# Patient Record
Sex: Male | Born: 1970 | Race: White | Hispanic: No | Marital: Married | Smoking: Former smoker
Health system: Southern US, Community
[De-identification: ages and names within clinical notes are randomized; demographics above are authoritative.]

## PROBLEM LIST (undated history)

## (undated) HISTORY — PX: ROTATOR CUFF REPAIR: SHX139

---

## 2009-06-15 HISTORY — PX: BUNIONECTOMY: SHX129

## 2015-09-24 MED FILL — GABAPENTIN 300 MG CAPSULE: 300 | 30 days supply | Qty: 30 | Fill #0

## 2015-10-02 ENCOUNTER — Other Ambulatory Visit: Payer: Self-pay | Admitting: Orthopedic Surgery

## 2015-10-02 DIAGNOSIS — M25511 Pain in right shoulder: Secondary | ICD-10-CM

## 2015-10-03 MED FILL — traMADol HCL 50 MG TABS: 50 | 15 days supply | Qty: 60 | Fill #0

## 2015-10-11 ENCOUNTER — Ambulatory Visit
Admission: RE | Admit: 2015-10-11 | Discharge: 2015-10-11 | Disposition: A | Discharge status: Home / Self Care | Payer: Managed Care, Other (non HMO) | Source: Ambulatory Visit | Attending: Orthopedic Surgery | Admitting: Orthopedic Surgery

## 2015-10-11 ENCOUNTER — Other Ambulatory Visit: Payer: Self-pay

## 2015-10-11 DIAGNOSIS — M25511 Pain in right shoulder: Secondary | ICD-10-CM

## 2015-10-21 MED FILL — traMADol HCL 50 MG TABS: 50 | 15 days supply | Qty: 60 | Fill #0

## 2015-10-23 MED FILL — IBUPROFEN 800 MG TABLET: 800 | 20 days supply | Qty: 60 | Fill #0

## 2015-10-23 MED FILL — oxyCODONE HCL 5 MG TABS: 5 | 8 days supply | Qty: 60 | Fill #0

## 2015-10-29 MED FILL — CYCLOBENZAPRINE 10 MG TAB: 10 | 10 days supply | Qty: 30 | Fill #0

## 2015-10-29 MED FILL — oxyCODONE HCL 5 MG TABS: 5 | 4 days supply | Qty: 30 | Fill #0

## 2015-10-30 ENCOUNTER — Ambulatory Visit: Payer: Managed Care, Other (non HMO) | Attending: Orthopedic Surgery | Admitting: Physical Therapy

## 2015-10-30 ENCOUNTER — Encounter: Payer: Self-pay | Admitting: Physical Therapy

## 2015-10-30 DIAGNOSIS — M25511 Pain in right shoulder: Secondary | ICD-10-CM

## 2015-10-30 DIAGNOSIS — M25611 Stiffness of right shoulder, not elsewhere classified: Secondary | ICD-10-CM | POA: Diagnosis present

## 2015-10-30 NOTE — Therapy (Addendum)
[facility] [address]  [address] [place], [place], [address] Phone: [phone]   Fax:  [phone]  Physical Therapy Evaluation  Patient Details  Name: William [name] MRN: [id number] Date of Birth: 1970/11/24 Referring Provider: Georgena Spurling  Encounter Date: 10/30/2015      PT End of Session - 10/30/15 1026    Visit Number 1   Number of Visits 20   Date for PT Re-Evaluation 01/08/16   PT Start Time 1025      History reviewed. No pertinent past medical history.  Past Surgical History  Procedure Laterality Date  . Bunionectomy Right 2011    There were no vitals filed for this visit.       Subjective Assessment - 10/30/15 1027    Subjective Pt underwent R Shoulder mini open RCR on 10/23/15 in addition to SAD and DCR.  Since surgery he has been wearing sling all waking hours but is able to sleep with arm propped on pillows in recliner at night. Is taking pain medications and applies ice to shoulder regularly.  States has been performing pendulums has HEP.   Currently in Pain? Yes   Pain Score --  5-8/10 with movement assessment today; 1-2/10 at rest   Pain Location Shoulder   Pain Orientation Right   Pain Onset 1 to 4 weeks ago   Pain Frequency Intermittent   Aggravating Factors  movement   Pain Relieving Factors rest, ice, medication            [facility] PT Assessment - 10/30/15 0001    Assessment   Medical Diagnosis s/p R RCR   Referring Provider Georgena Spurling   Onset Date/Surgical Date 10/23/15   Hand Dominance Right   Next MD Visit 12/03/15   Balance Screen   Has the patient fallen in the past 6 months No   Has the patient had a decrease in activity level because of a fear of falling?  No   Is the patient reluctant to leave their home because of a fear of falling?  No   Prior Function   Vocation Full time employment   Health visitor - heavy labor   Leisure hunting and fishing   ROM /  Strength   AROM / PROM / Strength PROM   PROM   PROM Assessment Site Shoulder   Right/Left Shoulder Right   Right Shoulder Flexion 65 Degrees  limited by pain   Right Shoulder ABduction 70 Degrees  limited by protocol, no pain   Right Shoulder Internal Rotation --  to abdomen at 0-30 ABD   Right Shoulder External Rotation --  8dg short of neutral in 0-30 ABD         TODAY'S TREATMENT Manual - grade 1 AP to R GH along with PROM per protocol limits or to pt tolerance.  Vasopneumatic compression - low pressure, R Shoulder, 40dg, 15'                  PT Short Term Goals - 10/30/15 1202    PT SHORT TERM GOAL #1   Title R Shoulder Flexion PROM to 140 by 11/20/15   Status New   PT SHORT TERM GOAL #2   Title R Shoulder ER PROM to 45 at 60-80 ABD by 11/27/15   Status New   PT SHORT TERM GOAL #3   Title pt able to sleep in bed and without limitation by shoulder pain by 12/04/15   Status  New           PT Long Term Goals - 10/30/15 1206    PT LONG TERM GOAL #1   Title R Shoulder AROM at least: Flexion 155, ER 80 in 80-90 ABD, Scaption 160, and IR reach to L5 by 01/08/16   Status New   PT LONG TERM GOAL #2   Title R Shoulder MMT 4/5 or better all planes by 01/08/16   Status New   PT LONG TERM GOAL #3   Title pt able to return to work at full duty or mildly modified duty (may have to limit overhead activities longer) by 01/08/16     Status New   PT LONG TERM GOAL #4   Title Pt independent with HEP for continued progression as necessary by 01/08/16   Status New               Plan - 10/30/15 1213    Clinical Impression Statement William [name] underwent R mini open RCR on 10/23/15 due to increasing pain and decreasing function of R shoulder.  He states prior to surgery he could not raise his hand overhead.  He is employed full time in [employer] and this requires great deal of physical labor and is therefore out of work right now.  He states he is currently sleeping  pretty well in a recliner. He states this allows him to remove his sling and prop his arm up on pillows. He has been performing pendulums with R UE since surgery as advised by MD.  Today's PROM assessment finds good ABD to 70 with no pain (limited by protocol) but Flexion limited to 65 due to pain and ER quite restricted.  He is unable to achieve neutral ER anywhere between 0-30 ABD and is in fact 8 degrees away from neutral.  He is quite guarded right now so I am hopeful this will improve quickly as he is able to relax more. Overall, William [name] should progress well.  He is very motivated and is good physical shape otherwise.  We will progress per RC protocol as tolerated.    Rehab Potential Good   PT Frequency 2x / week   PT Duration Other (comment)  10 wks   PT Treatment/Interventions Manual techniques;Therapeutic exercise;Vasopneumatic Device;Taping;Therapeutic activities;Electrical Stimulation;Cryotherapy;Patient/family education   PT Next Visit Plan R Shoulder PROM, ensure wrist/elbow AROM HEP is being performed; manual and modalities for pain and decreased guarding   Consulted and Agree with Plan of Care Patient      Patient will benefit from skilled therapeutic intervention in order to improve the following deficits and impairments:  Pain, Decreased strength, Decreased mobility, Decreased range of motion, Impaired UE functional use  Visit Diagnosis: Pain in right shoulder  Stiffness of right shoulder, not elsewhere classified     Problem List There are no active problems to display for this patient.   [facility],Inessa Wardrop PT, OCS 10/30/2015, 12:35 PM  [facility] [address]  [address] [place], [place], [address] Phone: [phone]   Fax:  [phone]  Name: [name] MRN: [id number] Date of Birth: 12/05/1970

## 2015-11-04 ENCOUNTER — Ambulatory Visit: Payer: Managed Care, Other (non HMO) | Admitting: Physical Therapy

## 2015-11-04 DIAGNOSIS — M25511 Pain in right shoulder: Secondary | ICD-10-CM

## 2015-11-04 DIAGNOSIS — M25611 Stiffness of right shoulder, not elsewhere classified: Secondary | ICD-10-CM

## 2015-11-04 NOTE — Therapy (Signed)
[facility] [address]  [address] [place], [place], [address] Phone: [phone]   Fax:  [phone]  Physical Therapy Treatment  Patient Details  Name: William [name] MRN: [id number] Date of Birth: 1971-05-14 Referring Provider: Georgena Spurling  Encounter Date: 11/04/2015      PT End of Session - 11/04/15 0716    Visit Number 2   Number of Visits 20   Date for PT Re-Evaluation 01/08/16   PT Start Time 0714   PT Stop Time 0755   PT Time Calculation (min) 41 min      No past medical history on file.  Past Surgical History  Procedure Laterality Date  . Bunionectomy Right 2011    There were no vitals filed for this visit.      Subjective Assessment - 11/04/15 0715    Subjective States shoulder is feeling better and pain no greater than 5/10 lately.  States has cut back on pain medication due to decreasing pain.  Performs pendulums 20+ x/day.   Currently in Pain? Yes   Pain Score 5    Pain Location Shoulder   Pain Orientation Right         TODAY'S TREATMENT Manual - R GH Grade 1 AP and caudal glides with PROM into ABD to 70, Flexion to tolerance (90 today), IR to abdomen at 0-30 ABD, and ER to 10-20 degrees in 0-30 ABD; grade 2 AP and caudal glides R ACJ.  Vasopneumatic compression - R Shoulder, Low Pressure, 38dg, 15'             PT Short Term Goals - 11/04/15 0749    PT SHORT TERM GOAL #1   Title R Shoulder Flexion PROM to 140 by 11/20/15   Status On-going   PT SHORT TERM GOAL #2   Title R Shoulder ER PROM to 45 at 60-80 ABD by 11/27/15   Status On-going   PT SHORT TERM GOAL #3   Title pt able to sleep in bed and without limitation by shoulder pain by 12/04/15   Status On-going           PT Long Term Goals - 11/04/15 0750    PT LONG TERM GOAL #1   Title R Shoulder AROM at least: Flexion 155, ER 80 in 80-90 ABD, Scaption 160, and IR reach to L5 by 01/08/16   Status On-going   PT LONG TERM  GOAL #2   Title R Shoulder MMT 4/5 or better all planes by 01/08/16   Status On-going   PT LONG TERM GOAL #3   Title pt able to return to work at full duty or mildly modified duty (may have to limit overhead activities longer) by 01/08/16     Status On-going   PT LONG TERM GOAL #4   Title Pt independent with HEP for continued progression as necessary by 01/08/16   Status On-going               Plan - 11/04/15 0746    Clinical Impression Statement Pain improving quite well already with pt stating pain hasn't been greater than 5/10 past several days.  States often feels nearly pain-free.  Is performing pendulums as well as elbow/wrist AROM throughout the day and ices shoulder 2-3x/day lately.  PROM today much improved and ER and ABD only limited by protocol at this point, Flexion PROM has improved to 90 and limited by pain/guarding at that point.   PT Next Visit Plan  R Shoulder PROM per protocol; manual and modalities for pain and decreased guarding   Consulted and Agree with Plan of Care Patient      Patient will benefit from skilled therapeutic intervention in order to improve the following deficits and impairments:  Pain, Decreased strength, Decreased mobility, Decreased range of motion, Impaired UE functional use  Visit Diagnosis: Pain in right shoulder  Stiffness of right shoulder, not elsewhere classified     Problem List There are no active problems to display for this patient.   Cyla Haluska PT, OCS 11/04/2015, 7:59 AM  [facility] [address]  [address] [place], [place], [address] Phone: [phone]   Fax:  [phone]  Name: William [name] MRN: [id number] Date of Birth: 03/04/1971

## 2015-11-06 ENCOUNTER — Ambulatory Visit: Payer: Managed Care, Other (non HMO) | Admitting: Physical Therapy

## 2015-11-06 DIAGNOSIS — M25511 Pain in right shoulder: Secondary | ICD-10-CM | POA: Diagnosis not present

## 2015-11-06 DIAGNOSIS — M25611 Stiffness of right shoulder, not elsewhere classified: Secondary | ICD-10-CM

## 2015-11-06 NOTE — Therapy (Signed)
[facility] Out[facility] [address]  [address] [place], [place], [address] Phone: [phone]   Fax:  [phone]  Physical Therapy Treatment  Patient Details  Name: William [name] MRN: [id number] Date of Birth: 1971-04-21 Referring Provider: Georgena Spurling  Encounter Date: 11/06/2015      PT End of Session - 11/06/15 0807    Visit Number 3   Number of Visits 20   Date for PT Re-Evaluation 01/08/16   PT Start Time 0802   PT Stop Time 0850   PT Time Calculation (min) 48 min      No past medical history on file.  Past Surgical History  Procedure Laterality Date  . Bunionectomy Right 2011    There were no vitals filed for this visit.      Subjective Assessment - 11/06/15 0802    Subjective States shoulder is feeling better and better stating most discomfort is due to sling use.  States is still sleeping in recliner with arm propped on pillows and states this is going fairly well.   Currently in Pain? Yes   Pain Score --  states AVG pain is 2-3/10 and notes intermittent short duration pains up to 4/10 when "moves it wrong"   Pain Location Shoulder   Pain Orientation Right            [facility] PT Assessment - 11/06/15 0001    PROM   Right Shoulder Flexion 112 Degrees   Right Shoulder ABduction 70 Degrees   Right Shoulder External Rotation --  30 at 30 ABD, 22 at 10 ABD       TODAY'S TREATMENT Manual - R GH Grade 1 AP and caudal glides with PROM into ABD to 70, Flexion to tolerance (112 today), IR to abdomen at 0-30 ABD, and ER to 10-30 degrees in 0-30 ABD; grade 2 AP and caudal glides R ACJ. Left Side-Lying R scapular mobs grade 3.  TherEx - L Side-Lying R scapular rolls AROM 15x each Seated Scapular retraction isometric 10x5"  Vasopneumatic compression - R Shoulder, Low Pressure, 38dg, 15'              PT Short Term Goals - 11/04/15 0749    PT SHORT TERM GOAL #1   Title R Shoulder Flexion PROM to 140 by  11/20/15   Status On-going   PT SHORT TERM GOAL #2   Title R Shoulder ER PROM to 45 at 60-80 ABD by 11/27/15   Status On-going   PT SHORT TERM GOAL #3   Title pt able to sleep in bed and without limitation by shoulder pain by 12/04/15   Status On-going           PT Long Term Goals - 11/04/15 0750    PT LONG TERM GOAL #1   Title R Shoulder AROM at least: Flexion 155, ER 80 in 80-90 ABD, Scaption 160, and IR reach to L5 by 01/08/16   Status On-going   PT LONG TERM GOAL #2   Title R Shoulder MMT 4/5 or better all planes by 01/08/16   Status On-going   PT LONG TERM GOAL #3   Title pt able to return to work at full duty or mildly modified duty (may have to limit overhead activities longer) by 01/08/16     Status On-going   PT LONG TERM GOAL #4   Title Pt independent with HEP for continued progression as necessary by 01/08/16   Status On-going  Plan - 11/06/15 0837    Clinical Impression Statement Mr. William [name] is doing exceptionally well to date. He is 2 weeks out from R RCR today and he states he is often pain-free and pain hasn't been greater than 4/10 at worst lately.  PROM has progressed to protocol limits (70 ABD, 30 ER at 30 ABD, and IR to Abdomen at 30 ABD) other than Flexion (no protocol limits) which has progressed to 112 dgs (was 65 last week at initial eval).  His protocol increases to allow some AAROM next week and pt is looking forward to this.   PT Next Visit Plan Progress HEP with protocol; R Shoulder PROM per protocol (next week: ABD to 90, Flexion to tolerance, ER to 45 at 0-45 ABD); begin pulleys, prone scap retraction with shoulder extension, and isometric IR and Extension; manual and modalities for pain and decreased guarding   Consulted and Agree with Plan of Care Patient      Patient will benefit from skilled therapeutic intervention in order to improve the following deficits and impairments:  Pain, Decreased strength, Decreased mobility, Decreased range  of motion, Impaired UE functional use  Visit Diagnosis: Pain in right shoulder  Stiffness of right shoulder, not elsewhere classified     Problem List There are no active problems to display for this patient.   [facility],Bradee Common PT, OCS 11/06/2015, 8:55 AM  [facility] [address]  [address] [place], [place], [address] Phone: [phone]   Fax:  [phone]  Name: William [name] MRN: [id number] Date of Birth: 06/08/1971

## 2015-11-08 MED FILL — oxyCODONE HCL 5 MG TABS: 5 | 4 days supply | Qty: 30 | Fill #0

## 2015-11-12 ENCOUNTER — Ambulatory Visit: Payer: Managed Care, Other (non HMO) | Admitting: Physical Therapy

## 2015-11-12 DIAGNOSIS — M25511 Pain in right shoulder: Secondary | ICD-10-CM

## 2015-11-12 DIAGNOSIS — M25611 Stiffness of right shoulder, not elsewhere classified: Secondary | ICD-10-CM

## 2015-11-12 NOTE — Therapy (Signed)
[name] [address]  [address] [place], [place], [address] Phone: [phone]   Fax:  [phone]  Physical Therapy Treatment  Patient Details  Name: William BarrDonald W [name] MRN: [id number] Date of Birth: 03/07/1971 Referring Provider: Georgena SpurlingStephen Lucey  Encounter Date: 11/12/2015      William [name] End of Session - 11/12/15 1153    Visit Number 4   Number of Visits 20   Date for William [name] Re-Evaluation 01/08/16   William [name] Start Time 1116  vaso unavailable and William [name] declined ice   William [name] Stop Time 1148   William [name] Time Calculation (min) 32 min   Activity Tolerance Patient tolerated treatment well   Behavior During Therapy [facility] for tasks assessed/performed      No past medical history on file.  Past Surgical History  Procedure Laterality Date  . Bunionectomy Right 2011    There were no vitals filed for this visit.      Subjective Assessment - 11/12/15 1116    Subjective hasn't taken pain medication in over a week but overall feels good.  has some tenderness   Currently in Pain? No/denies            [facility] William [name] Assessment - 11/12/15 1133    PROM   Right Shoulder Flexion 133 Degrees   Right Shoulder External Rotation --  25 deg in 10 deg abdct                     [facility] Adult William [name] Treatment/Exercise - 11/12/15 1140    Exercises   Exercises Shoulder   Shoulder Exercises: Seated   Retraction Both;15 reps   Other Seated Exercises post shoulder rolls 2x15   Manual Therapy   Manual Therapy Joint mobilization;Passive ROM   Joint Mobilization A/P and inf R shoulder grade 2 mobilizations   Passive ROM R shoulder flexion; abdct to 70; er 25-30 deg up to 30 deg of abdct                  William [name] Short Term Goals - 11/04/15 0749    William [name] SHORT TERM GOAL #1   Title R Shoulder Flexion PROM to 140 by 11/20/15   Status On-going   William [name] SHORT TERM GOAL #2   Title R Shoulder ER PROM to 45 at 60-80 ABD by 11/27/15   Status On-going   William [name] SHORT TERM GOAL #3    Title William [name] able to sleep in bed and without limitation by shoulder pain by 12/04/15   Status On-going           William [name] Long Term Goals - 11/04/15 0750    William [name] LONG TERM GOAL #1   Title R Shoulder AROM at least: Flexion 155, ER 80 in 80-90 ABD, Scaption 160, and IR reach to L5 by 01/08/16   Status On-going   William [name] LONG TERM GOAL #2   Title R Shoulder MMT 4/5 or better all planes by 01/08/16   Status On-going   William [name] LONG TERM GOAL #3   Title William [name] able to return to work at full duty or mildly modified duty (may have to limit overhead activities longer) by 01/08/16     Status On-going   William [name] LONG TERM GOAL #4   Title William [name] independent with HEP for continued progression as necessary by 01/08/16   Status On-going               Plan - 11/12/15 1154    Clinical Impression Statement  William [name] continues to demonstrate excellent progress with flexion PROM improved to 133 degress.  Ready to begin AAROM per protocol as William [name] 3 weeks post op tomorrow 11/13/15.   William [name] Next Visit Plan Progress HEP with protocol; R Shoulder PROM per protocol (next week: ABD to 90, Flexion to tolerance, ER to 45 at 0-45 ABD); begin pulleys, prone scap retraction with shoulder extension, and isometric IR and Extension; manual and modalities for pain and decreased guarding   Consulted and Agree with Plan of Care Patient      Patient will benefit from skilled therapeutic intervention in order to improve the following deficits and impairments:     Visit Diagnosis: Pain in right shoulder  Stiffness of right shoulder, not elsewhere classified     Problem List There are no active problems to display for this patient.  William [name], William [name], William [name] 11/12/2015 11:56 AM  [facility] [address]  [address] [place], [place], [address] Phone: [phone]   Fax:  [phone]  Name: William [name] MRN: [id number] Date of Birth: [birth year]

## 2015-11-15 ENCOUNTER — Ambulatory Visit: Payer: Managed Care, Other (non HMO) | Attending: Orthopedic Surgery

## 2015-11-15 DIAGNOSIS — M25511 Pain in right shoulder: Secondary | ICD-10-CM | POA: Diagnosis not present

## 2015-11-15 DIAGNOSIS — M25611 Stiffness of right shoulder, not elsewhere classified: Secondary | ICD-10-CM | POA: Diagnosis present

## 2015-11-15 NOTE — Therapy (Signed)
[facility] [address]  [address] [place], [place], [address] Phone: [phone]   Fax:  [phone]  Physical Therapy Treatment  Patient Details  Name: William [name] MRN: [id number] Date of Birth: 10/30/1970 Referring Provider: Georgena SpurlingStephen Lucey  Encounter Date: 11/15/2015      PT End of Session - 11/15/15 1147    Visit Number 5   Number of Visits 20   Date for PT Re-Evaluation 01/08/16   PT Start Time 1108   PT Stop Time 1157   PT Time Calculation (min) 49 min   Activity Tolerance Patient tolerated treatment well   Behavior During Therapy [facility] for tasks assessed/performed      History reviewed. No pertinent past medical history.  Past Surgical History  Procedure Laterality Date  . Bunionectomy Right 2011    There were no vitals filed for this visit.      Subjective Assessment - 11/15/15 1112    Subjective Pt. reports he isn't taking pain medication at all at this point.  0/10 pain currently at the R shoulder.     Currently in Pain? No/denies   Pain Score 0-No pain   Multiple Pain Sites No      TODAY'S TREATMENT: Manual: R GH Grade 1 AP and caudal glides with Flexion to tolerance (110 dg today), IR to abdomen at 0-30 ABD, and ER to 10-30 degrees in 0-30 ABD Left Side-Lying R scapular mobs grade 2.  TherEx: L Side-Lying R scapular rolls AROM 15x each R prone pendulums x 15 reps each direction Prone scapular retraction handing off table  Prone scapular retraction / extension to neutral x 10 reps Seated Scapular retraction isometric 10x5"  Vasopneumatic compression: R Shoulder, Seated, Low Pressure, coldest temp, 30dg, 2 pillows under elbow, 10'        PT Short Term Goals - 11/04/15 0749    PT SHORT TERM GOAL #1   Title R Shoulder Flexion PROM to 140 by 11/20/15   Status On-going   PT SHORT TERM GOAL #2   Title R Shoulder ER PROM to 45 at 60-80 ABD by 11/27/15   Status On-going   PT SHORT TERM GOAL #3    Title pt able to sleep in bed and without limitation by shoulder pain by 12/04/15   Status On-going           PT Long Term Goals - 11/04/15 0750    PT LONG TERM GOAL #1   Title R Shoulder AROM at least: Flexion 155, ER 80 in 80-90 ABD, Scaption 160, and IR reach to L5 by 01/08/16   Status On-going   PT LONG TERM GOAL #2   Title R Shoulder MMT 4/5 or better all planes by 01/08/16   Status On-going   PT LONG TERM GOAL #3   Title pt able to return to work at full duty or mildly modified duty (may have to limit overhead activities longer) by 01/08/16     Status On-going   PT LONG TERM GOAL #4   Title Pt independent with HEP for continued progression as necessary by 01/08/16   Status On-going               Plan - 11/15/15 1148    Clinical Impression Statement Pt. doing very well at this point with 0/10 R shoulder pain initially and with PROM / scapular strengthening activities.  R shoulder PROM not measured today however pt. R shoulder PROM progressing well tolerating gentle  stretching into ~ 110 dg flexion, ~ 80 dg IR (in 0-30dg ABD), ~ 30 dg ER  (in 0-30dg ABD),    PT Treatment/Interventions Manual techniques;Therapeutic exercise;Vasopneumatic Device;Taping;Therapeutic activities;Electrical Stimulation;Cryotherapy;Patient/family education   PT Next Visit Plan Progress HEP with protocol; R Shoulder PROM per protocol (next week: ABD to 90, Flexion to tolerance, ER to 45 at 0-45 ABD); begin pulleys, prone scap retraction with shoulder extension, and isometric IR and Extension; manual and modalities for pain and decreased guarding      Patient will benefit from skilled therapeutic intervention in order to improve the following deficits and impairments:  Pain, Decreased strength, Decreased mobility, Decreased range of motion, Impaired UE functional use  Visit Diagnosis: Pain in right shoulder  Stiffness of right shoulder, not elsewhere classified     Problem List There are no  active problems to display for this patient.   Kermit Balo, PTA 11/15/2015, 12:06 PM  [facility] [address]  [address] [place], [place], [address] Phone: [phone]   Fax:  [phone]  Name: William [name] MRN: [id number] Date of Birth: [birth year]

## 2015-11-19 ENCOUNTER — Ambulatory Visit: Payer: Managed Care, Other (non HMO) | Admitting: Physical Therapy

## 2015-11-19 DIAGNOSIS — M25511 Pain in right shoulder: Secondary | ICD-10-CM

## 2015-11-19 DIAGNOSIS — M25611 Stiffness of right shoulder, not elsewhere classified: Secondary | ICD-10-CM

## 2015-11-19 NOTE — Therapy (Signed)
[facility] [place] [address]  [address] [place], [place], [address] Phone: [phone]   Fax:  [phone]  Physical Therapy Treatment  Patient Details  Name: William [name] MRN: [id number] Date of Birth: 08/12/1970 Referring Provider: Georgena SpurlingStephen Lucey  Encounter Date: 11/19/2015      William [name] End of Session - 11/19/15 0844    Visit Number 6   Number of Visits 20   Date for William [name] Re-Evaluation 01/08/16   William [name] Start Time 0802   William [name] Stop Time 0858   William [name] Time Calculation (min) 56 min   Activity Tolerance Patient tolerated treatment well   Behavior During Therapy [facility] for tasks assessed/performed      No past medical history on file.  Past Surgical History  Procedure Laterality Date  . Bunionectomy Right 2011    There were no vitals filed for this visit.      Subjective Assessment - 11/19/15 0804    Subjective doing well; no pain   Currently in Pain? No/denies            [facility] [name] Assessment - 11/19/15 0821    PROM   Right Shoulder Flexion 140 Degrees   Right Shoulder ABduction 90 Degrees  within protocol   Right Shoulder External Rotation 45 Degrees  in 45 degrees abdct within protocol limitation                     [facility] Adult William [name] Treatment/Exercise - 11/19/15 0806    Shoulder Exercises: Supine   External Rotation AAROM;Right;15 reps;Limitations   External Rotation Limitations with cane and within protocol limitations   Flexion AAROM;15 reps   Flexion Limitations cane   ABduction AAROM;Right;15 reps   ABduction Limitations with cane to 90   Other Supine Exercises scap retraction x 15   Shoulder Exercises: Prone   Extension Right;15 reps;Limitations   Extension Limitations to neutral   Shoulder Exercises: Pulleys   Flexion 3 minutes   ABduction 3 minutes   ABduction Limitations scaption; stopped at 90 degrees and cues to keep shoulder down   Shoulder Exercises: Isometric Strengthening   Extension  Limitations 10x5"   Internal Rotation --   Internal Rotation Limitations 10x5"   Modalities   Modalities Vasopneumatic   Vasopneumatic   Number Minutes Vasopneumatic  15 minutes   Vasopnuematic Location  Shoulder   Vasopneumatic Pressure Low   Vasopneumatic Temperature  max cold   Manual Therapy   Manual Therapy Joint mobilization;Passive ROM   Joint Mobilization A/P and inf R shoulder grade 2 mobilizations   Passive ROM R shoulder flexion; abdct to 90; er 45 deg up to 45 deg of abdct                  William [name] Short Term Goals - 11/19/15 0847    William [name] SHORT TERM GOAL #1   Title R Shoulder Flexion PROM to 140 by 11/20/15   Status Achieved   William [name] SHORT TERM GOAL #2   Title R Shoulder ER PROM to 45 at 60-80 ABD by 11/27/15   Status On-going   William [name] SHORT TERM GOAL #3   Title William [name] able to sleep in bed and without limitation by shoulder pain by 12/04/15   Status On-going           William [name] Long Term Goals - 11/04/15 0750    William [name] LONG TERM GOAL #1   Title R Shoulder AROM at least: Flexion 155, ER 80 in 80-90  ABD, Scaption 160, and IR reach to L5 by 01/08/16   Status On-going   William [name] LONG TERM GOAL #2   Title R Shoulder MMT 4/5 or better all planes by 01/08/16   Status On-going   William [name] LONG TERM GOAL #3   Title William [name] able to return to work at full duty or mildly modified duty (may have to limit overhead activities longer) by 01/08/16     Status On-going   William [name] LONG TERM GOAL #4   Title William [name] independent with HEP for continued progression as necessary by 01/08/16   Status On-going               Plan - 11/19/15 0844    Clinical Impression Statement William [name] tolerated increased activity well today without increase in pain.  PROM achieved to limits of protocol.  Will be able to progress ROM and begin theraband exercises next session.     William [name] Next Visit Plan 4 weeks 11/20/15 - progress as protocol allows   Consulted and Agree with Plan of Care Patient      Patient will benefit from skilled therapeutic  intervention in order to improve the following deficits and impairments:     Visit Diagnosis: Pain in right shoulder  Stiffness of right shoulder, not elsewhere classified     Problem List There are no active problems to display for this patient.  William [name], William [name], William [name] 11/19/2015 9:01 AM  [facility] [address]  [address] [place], [place], [address] Phone: [phone]   Fax:  [phone]  Name: William [name] MRN: [id number] Date of Birth: 1970/10/08

## 2015-11-22 ENCOUNTER — Ambulatory Visit: Payer: Managed Care, Other (non HMO)

## 2015-11-22 DIAGNOSIS — M25511 Pain in right shoulder: Secondary | ICD-10-CM | POA: Diagnosis not present

## 2015-11-22 DIAGNOSIS — M25611 Stiffness of right shoulder, not elsewhere classified: Secondary | ICD-10-CM

## 2015-11-22 NOTE — Therapy (Signed)
[facility] [place] [address]  [address] [place], [place], [address] Phone: [phone]   Fax:  [phone]  Physical Therapy Treatment  Patient Details  Name: William [name] MRN: [id number] Date of Birth: 03/13/1971 Referring Provider: Georgena SpurlingStephen Lucey   Encounter Date: 11/22/2015      PT End of Session - 11/22/15 1303    Visit Number 7   Number of Visits 20   Date for PT Re-Evaluation 01/08/16   PT Start Time 0805   PT Stop Time 0900   PT Time Calculation (min) 55 min   Activity Tolerance Patient tolerated treatment well   Behavior During Therapy [facility] for tasks assessed/performed      History reviewed. No pertinent past medical history.  Past Surgical History  Procedure Laterality Date  . Bunionectomy Right 2011    There were no vitals filed for this visit.      Subjective Assessment - 11/22/15 1301    Subjective Pt. reports he is doing great today with no pain and is currently only wearing the brace while out of the house.     Currently in Pain? No/denies   Pain Score 0-No pain   Multiple Pain Sites No            [facility] PT Assessment - 11/22/15 0001    Assessment   Medical Diagnosis s/p R RCR   Referring Provider Georgena SpurlingStephen Lucey    Hand Dominance Right   Next MD Visit 12/03/15      Today's Treatment:  Therex: R shoulder pendulums x 15 CW, CCW  Manual:  R shoulder PROM in all directions  R shoulder posterior, caudal, inferior mobs   Therex: R shoulder AAROM flexion with wand x 10 reps  R shoulder AAROM ER with wand x 10 reps R shoulder AAROM flexion with pulleys x 10 reps each  R shoulder AAROM scaption with pulleys x 10 reps each  R shoulder ER with yellow TB 2 x 10 reps R shoulder IR with yellow TB 2 x 10 reps  B shoulder retraction with green TB x 10 reps  B shoulder shoulder retraction / Extension with green TB x 10 reps   Vasoneumatic device to R shoulder: low compression, coldest temp., 15 min,  seated, ~30 ABD       PT Short Term Goals - 11/19/15 0847    PT SHORT TERM GOAL #1   Title R Shoulder Flexion PROM to 140 by 11/20/15   Status Achieved   PT SHORT TERM GOAL #2   Title R Shoulder ER PROM to 45 at 60-80 ABD by 11/27/15   Status On-going   PT SHORT TERM GOAL #3   Title pt able to sleep in bed and without limitation by shoulder pain by 12/04/15   Status On-going           PT Long Term Goals - 11/04/15 0750    PT LONG TERM GOAL #1   Title R Shoulder AROM at least: Flexion 155, ER 80 in 80-90 ABD, Scaption 160, and IR reach to L5 by 01/08/16   Status On-going   PT LONG TERM GOAL #2   Title R Shoulder MMT 4/5 or better all planes by 01/08/16   Status On-going   PT LONG TERM GOAL #3   Title pt able to return to work at full duty or mildly modified duty (may have to limit overhead activities longer) by 01/08/16     Status On-going  PT LONG TERM GOAL #4   Title Pt independent with HEP for continued progression as necessary by 01/08/16   Status On-going               Plan - 11/22/15 1304    Clinical Impression Statement Pt. tolerated all  R shoulder AAROM and PROM well today with significant ROM inprovements since last week.  Scapular strengthening activities progressed today with pt. tolerating very well.  pt. with F/U with MD on 6/21 regarding R shoulder status.     PT Treatment/Interventions Manual techniques;Therapeutic exercise;Vasopneumatic Device;Taping;Therapeutic activities;Electrical Stimulation;Cryotherapy;Patient/family education   PT Next Visit Plan 5 weeks 11/20/15 - progress as protocol allows      Patient will benefit from skilled therapeutic intervention in order to improve the following deficits and impairments:  Pain, Decreased strength, Decreased mobility, Decreased range of motion, Impaired UE functional use  Visit Diagnosis: Pain in right shoulder  Stiffness of right shoulder, not elsewhere classified     Problem List There are no  active problems to display for this patient.   William [name], PTA 11/22/2015, 1:11 PM  [facility] [address]  [address] [place], [place], [address] Phone: [phone]   Fax:  [phone]  Name: William [name] MRN: [id number] Date of Birth: 05-Jan-1971

## 2015-11-26 ENCOUNTER — Ambulatory Visit: Payer: Managed Care, Other (non HMO) | Admitting: Physical Therapy

## 2015-11-26 DIAGNOSIS — M25511 Pain in right shoulder: Secondary | ICD-10-CM | POA: Diagnosis not present

## 2015-11-26 DIAGNOSIS — M25611 Stiffness of right shoulder, not elsewhere classified: Secondary | ICD-10-CM

## 2015-11-26 NOTE — Therapy (Signed)
[facility] [address]  [address] [place], [place], [address] Phone: [phone]   Fax:  [phone]  Physical Therapy Treatment  Patient Details  Name: William [name] MRN: [id number] Date of Birth: [birth year] Referring Provider: Georgena Spurling   Encounter Date: 11/26/2015      PT End of Session - 11/26/15 0836    Visit Number 8   Number of Visits 20   Date for PT Re-Evaluation 01/08/16   PT Start Time 0757   PT Stop Time 0852   PT Time Calculation (min) 55 min   Activity Tolerance Patient tolerated treatment well   Behavior During Therapy [facility] for tasks assessed/performed      No past medical history on file.  Past Surgical History  Procedure Laterality Date  . Bunionectomy Right 2011    There were no vitals filed for this visit.      Subjective Assessment - 11/26/15 0800    Subjective no pain; to MD next week.   Currently in Pain? No/denies                         [facility] Adult PT Treatment/Exercise - 11/26/15 0800    Shoulder Exercises: Supine   Protraction Right;15 reps;Weights   Protraction Weight (lbs) 3   Other Supine Exercises scap retraction x 15   Shoulder Exercises: Seated   Elevation Right;15 reps;Weights   Elevation Weight (lbs) 3   Elevation Limitations sitting   Shoulder Exercises: Standing   External Rotation Right;10 reps;Theraband   Theraband Level (Shoulder External Rotation) Level 1 (Yellow)   Internal Rotation Right;10 reps;Theraband   Theraband Level (Shoulder Internal Rotation) Level 1 (Yellow)   Internal Rotation Limitations at 0 degrees abduction   Extension Right;10 reps;Theraband   Theraband Level (Shoulder Extension) Level 1 (Yellow)   Retraction Right;10 reps;Theraband   Theraband Level (Shoulder Retraction) Level 1 (Yellow)   Retraction Limitations with 10 sec hold   Shoulder Exercises: Pulleys   Flexion 3 minutes   ABduction 3 minutes   ABduction  Limitations abduction no limit   Shoulder Exercises: Isometric Strengthening   Flexion Limitations 10x10"   Extension Supine  10x10"   External Rotation Limitations 10x10"   Internal Rotation Limitations 10x10"   ABduction Limitations 10x10"   Vasopneumatic   Number Minutes Vasopneumatic  15 minutes   Vasopnuematic Location  Shoulder   Vasopneumatic Pressure Low   Vasopneumatic Temperature  max cold   Manual Therapy   Manual Therapy Passive ROM   Passive ROM PROM and AAROM flexion, abduction, ir and er (from 0-80 degrees abduction)                  PT Short Term Goals - 11/19/15 0847    PT SHORT TERM GOAL #1   Title R Shoulder Flexion PROM to 140 by 11/20/15   Status Achieved   PT SHORT TERM GOAL #2   Title R Shoulder ER PROM to 45 at 60-80 ABD by 11/27/15   Status On-going   PT SHORT TERM GOAL #3   Title pt able to sleep in bed and without limitation by shoulder pain by 12/04/15   Status On-going           PT Long Term Goals - 11/04/15 0750    PT LONG TERM GOAL #1   Title R Shoulder AROM at least: Flexion 155, ER 80 in 80-90 ABD, Scaption 160, and IR reach to L5  by 01/08/16   Status On-going   PT LONG TERM GOAL #2   Title R Shoulder MMT 4/5 or better all planes by 01/08/16   Status On-going   PT LONG TERM GOAL #3   Title pt able to return to work at full duty or mildly modified duty (may have to limit overhead activities longer) by 01/08/16     Status On-going   PT LONG TERM GOAL #4   Title Pt independent with HEP for continued progression as necessary by 01/08/16   Status On-going               Plan - 11/26/15 0836    Clinical Impression Statement Pt tolerated exercises well without increase in pain.  Will look to measure and progress exercises per protocol next session.   PT Next Visit Plan add yellow theraband flexion and horizontal abduction; standing cane exercises - measure   Consulted and Agree with Plan of Care Patient      Patient will  benefit from skilled therapeutic intervention in order to improve the following deficits and impairments:     Visit Diagnosis: Pain in right shoulder  Stiffness of right shoulder, not elsewhere classified     Problem List There are no active problems to display for this patient.  Clarita CraneStephanie F Andrian Sabala, PT, DPT 11/26/2015 9:36 AM  [facility] [place] [address]  [address] [place], [place], [address] Phone: [phone]   Fax:  [phone]  Name: William [name] MRN: [id number] Date of Birth: 09/18/1970

## 2015-11-29 ENCOUNTER — Ambulatory Visit: Payer: Managed Care, Other (non HMO)

## 2015-11-29 DIAGNOSIS — M25511 Pain in right shoulder: Secondary | ICD-10-CM

## 2015-11-29 DIAGNOSIS — M25611 Stiffness of right shoulder, not elsewhere classified: Secondary | ICD-10-CM

## 2015-11-29 NOTE — Therapy (Signed)
[facility] [address]  [address] [place], [place], [address] Phone: [phone]   Fax:  [phone]  Physical Therapy Treatment  Patient Details  Name: William [name] MRN: [id number] Date of Birth: August 14, 1970 Referring Provider: Georgena Spurling   Encounter Date: 11/29/2015      PT End of Session - 11/29/15 0852    Visit Number 9   Number of Visits 20   Date for PT Re-Evaluation 01/08/16   PT Start Time 0804   PT Stop Time 0855   PT Time Calculation (min) 51 min   Activity Tolerance Patient tolerated treatment well   Behavior During Therapy [facility] for tasks assessed/performed      History reviewed. No pertinent past medical history.  Past Surgical History  Procedure Laterality Date  . Bunionectomy Right 2011    There were no vitals filed for this visit.      Subjective Assessment - 11/29/15 1024    Subjective Pt. reports R shoulder feeling good with no complaints; pt. to see MD on the 20th of this month on Tuesday.     Currently in Pain? No/denies   Pain Score 0-No pain   Multiple Pain Sites No       Today's Treatment:  Manual: R shoulder GH posterior, caudal mobilizations  R shoulder PROM all directions  Therex: Protraction 3# 2 x 15 reps  Shoulder circles with 2# CW, CCW 2 x 15 reps ([employer] pizzas) Standing shoulder ER/IR with yellow x 15 TB  Standing R shoulder scaption no weight x 15 reps  Standing R shoulder scaptiion with yellow TB x 10 reps; fatigue reported but no pain  Prone R shoulder extension, horizontal abduction (no weight) x 10 reps Supine horizontal abduction (no weight) x 15 reps  Supine ER (with towel as cue) with yellow TB x 10 reps  Standing shoulder retraction with green TB x 15 reps  Standing shoulder retraction/shoulder extension with red TB x 15 reps   Vasoneumatic device to R shoulder: Seated, low compression, coldest temperature, ~ 30 dg abduction, 10 min,          PT  Short Term Goals - 11/19/15 0847    PT SHORT TERM GOAL #1   Title R Shoulder Flexion PROM to 140 by 11/20/15   Status Achieved   PT SHORT TERM GOAL #2   Title R Shoulder ER PROM to 45 at 60-80 ABD by 11/27/15   Status On-going   PT SHORT TERM GOAL #3   Title pt able to sleep in bed and without limitation by shoulder pain by 12/04/15   Status On-going           PT Long Term Goals - 11/04/15 0750    PT LONG TERM GOAL #1   Title R Shoulder AROM at least: Flexion 155, ER 80 in 80-90 ABD, Scaption 160, and IR reach to L5 by 01/08/16   Status On-going   PT LONG TERM GOAL #2   Title R Shoulder MMT 4/5 or better all planes by 01/08/16   Status On-going   PT LONG TERM GOAL #3   Title pt able to return to work at full duty or mildly modified duty (may have to limit overhead activities longer) by 01/08/16     Status On-going   PT LONG TERM GOAL #4   Title Pt independent with HEP for continued progression as necessary by 01/08/16   Status On-going  Plan - 11/29/15 1025    Clinical Impression Statement Pt. with no pain today in R shoulder initially or with therex.  Pt. tolerated all scapular and conservative R shoulder strengthening today very well with introduction of supine horizontal abd, and scaption activities; pt. reported only R shoulder fatigue following standing B scaption with yellow TB.  Pt. progressing with protocol at 5 weeks status will see PT on 6/19 for goal assessment; pt. to see MD on 6/20 for f/u regarding R shoulder status.     PT Treatment/Interventions Manual techniques;Therapeutic exercise;Vasopneumatic Device;Taping;Therapeutic activities;Electrical Stimulation;Cryotherapy;Patient/family education   PT Next Visit Plan Add yellow theraband flexion and horizontal abduction; standing cane exercises - measure      Patient will benefit from skilled therapeutic intervention in order to improve the following deficits and impairments:  Pain, Decreased strength,  Decreased mobility, Decreased range of motion, Impaired UE functional use  Visit Diagnosis: Pain in right shoulder  Stiffness of right shoulder, not elsewhere classified     Problem List There are no active problems to display for this patient.   Kermit BaloMicah Mccauley Diehl, PTA 11/29/2015, 10:39 AM  [facility] [place] [address]  [address] [place], [place], [address] Phone: [phone]   Fax:  [phone]  Name: William [name] MRN: [id number] Date of Birth: 10/12/1970

## 2015-12-02 ENCOUNTER — Ambulatory Visit: Payer: Managed Care, Other (non HMO) | Admitting: Physical Therapy

## 2015-12-02 DIAGNOSIS — M25611 Stiffness of right shoulder, not elsewhere classified: Secondary | ICD-10-CM

## 2015-12-02 DIAGNOSIS — M25511 Pain in right shoulder: Secondary | ICD-10-CM | POA: Diagnosis not present

## 2015-12-02 NOTE — Therapy (Signed)
[facility] [place] [address]  [address] [place], [place], [address] Phone: [phone]   Fax:  [phone]  Physical Therapy Treatment  Patient Details  Name: William [name] MRN: [id number] Date of Birth: 09/06/1970 Referring Provider: Georgena SpurlingStephen Lucey   Encounter Date: 12/02/2015      PT End of Session - 12/02/15 0927    Visit Number 10   Number of Visits 20   Date for PT Re-Evaluation 01/08/16   PT Start Time 0845   PT Stop Time 0934   PT Time Calculation (min) 49 min   Activity Tolerance Patient tolerated treatment well   Behavior During Therapy [facility] for tasks assessed/performed      No past medical history on file.  Past Surgical History  Procedure Laterality Date  . Bunionectomy Right 2011    There were no vitals filed for this visit.      Subjective Assessment - 12/02/15 0847    Subjective See Dr. Sherlean FootLucey tomorrow; didn't do anything over the weekend.   Currently in Pain? No/denies            [facility] PT Assessment - 12/02/15 0854    ROM / Strength   AROM / PROM / Strength AROM   AROM   Overall AROM Comments performed active assisted   AROM Assessment Site Shoulder   Right/Left Shoulder Right   Right Shoulder Flexion 144 Degrees   Right Shoulder ABduction 142 Degrees   Right Shoulder Internal Rotation 78 Degrees   Right Shoulder External Rotation 66 Degrees   PROM   Right Shoulder Flexion 153 Degrees   Right Shoulder ABduction 165 Degrees   Right Shoulder Internal Rotation 85 Degrees   Right Shoulder External Rotation 80 Degrees                     [facility] Adult PT Treatment/Exercise - 12/02/15 0848    Shoulder Exercises: Supine   Protraction Right;20 reps;Weights   Protraction Weight (lbs) 5   Shoulder Exercises: Prone   Retraction Right;20 reps   Retraction Weight (lbs) 3#   Extension Right;20 reps   Extension Limitations to neutral   Horizontal ABduction 1 Right;20 reps   Shoulder  Exercises: Standing   External Rotation Right;10 reps;Theraband   Theraband Level (Shoulder External Rotation) Level 1 (Yellow)   Internal Rotation Right;10 reps;Theraband   Theraband Level (Shoulder Internal Rotation) Level 1 (Yellow)   Flexion Right;10 reps;Theraband   Theraband Level (Shoulder Flexion) Level 1 (Yellow)   Other Standing Exercises R shoulder rolls backwards (elevation/retraction) 2x10 with 5#   Other Standing Exercises standing cane: flexion, abduction, extension, ir and er x 20 each; min cues with mirror to decrease substitution   Shoulder Exercises: Pulleys   Flexion 3 minutes   ABduction 3 minutes   ABduction Limitations abduction no limit   Other Pulley Exercises internal rotation x 2 min   Vasopneumatic   Number Minutes Vasopneumatic  10 minutes   Vasopnuematic Location  Shoulder   Vasopneumatic Pressure Low   Vasopneumatic Temperature  max cold                  PT Short Term Goals - 12/02/15 0932    PT SHORT TERM GOAL #1   Title R Shoulder Flexion PROM to 140 by 11/20/15   Status Achieved   PT SHORT TERM GOAL #2   Title R Shoulder ER PROM to 45 at 60-80 ABD by 11/27/15   Status Achieved  PT SHORT TERM GOAL #3   Title pt able to sleep in bed and without limitation by shoulder pain by 12/04/15   Status On-going           PT Long Term Goals - 11/04/15 0750    PT LONG TERM GOAL #1   Title R Shoulder AROM at least: Flexion 155, ER 80 in 80-90 ABD, Scaption 160, and IR reach to L5 by 01/08/16   Status On-going   PT LONG TERM GOAL #2   Title R Shoulder MMT 4/5 or better all planes by 01/08/16   Status On-going   PT LONG TERM GOAL #3   Title pt able to return to work at full duty or mildly modified duty (may have to limit overhead activities longer) by 01/08/16     Status On-going   PT LONG TERM GOAL #4   Title Pt independent with HEP for continued progression as necessary by 01/08/16   Status On-going               Plan - 12/02/15 0927     Clinical Impression Statement Pt continues to be pain free and tolerates exercises within the protocol well without difficulty.  ROM improved to above numbers and near full ROM of R shoulder.  Pt needs min cues (and visual feedback) to decreased substitution patterns, but is able to correct.  Will continue to benefit from PT to maximize function; restore full ROM and regain strength.  Anticipate progression of strengthening exercises after MD visit.   PT Next Visit Plan progress as protocol allows; follow up on MD visit   Consulted and Agree with Plan of Care Patient      Patient will benefit from skilled therapeutic intervention in order to improve the following deficits and impairments:     Visit Diagnosis: Pain in right shoulder  Stiffness of right shoulder, not elsewhere classified     Problem List There are no active problems to display for this patient.  William [name], PT, DPT 12/02/2015 9:34 AM  [facility] [address]  [address] [place], [place], [address] Phone: [phone]   Fax:  [phone]  Name: William [name] MRN: [id number] Date of Birth: March 25, 1971

## 2015-12-03 ENCOUNTER — Ambulatory Visit: Payer: Managed Care, Other (non HMO)

## 2015-12-06 ENCOUNTER — Ambulatory Visit: Payer: Managed Care, Other (non HMO) | Admitting: Physical Therapy

## 2015-12-06 DIAGNOSIS — M25511 Pain in right shoulder: Secondary | ICD-10-CM

## 2015-12-06 DIAGNOSIS — M25611 Stiffness of right shoulder, not elsewhere classified: Secondary | ICD-10-CM

## 2015-12-06 NOTE — Therapy (Signed)
[facility] [address]  [address] [place], [place], [address] Phone: [phone]   Fax:  [phone]  Physical Therapy Treatment  Patient Details  Name: William [name] MRN: [id number] Date of Birth: 09/01/1970 Referring Provider: Georgena SpurlingStephen [name]   Encounter Date: 12/06/2015      PT End of Session - 12/06/15 0856    Visit Number 11   Number of Visits 20   Date for PT Re-Evaluation 01/08/16   PT Start Time 0808   PT Stop Time 0853   PT Time Calculation (min) 45 min   Activity Tolerance Patient tolerated treatment well;No increased pain   Behavior During Therapy [facility] for tasks assessed/performed      No past medical history on file.  Past Surgical History  Procedure Laterality Date  . Bunionectomy Right 2011    There were no vitals filed for this visit.      Subjective Assessment - 12/06/15 0813    Subjective doing well; MD visit went well, doctor pleased with progress   Currently in Pain? No/denies                         [facility] Treatment/Exercise - 12/06/15 0813    Shoulder Exercises: Supine   Protraction Right;20 reps;Weights   Protraction Weight (lbs) 5   Shoulder Exercises: Seated   Other Seated Exercises seated chair press 20x5 sec   Shoulder Exercises: Prone   Extension Right;20 reps;Weights   Extension Weight (lbs) 2#   External Rotation Right;20 reps;Weights   External Rotation Weight (lbs) 2   Internal Rotation Right;20 reps;Weights   Internal Rotation Weight (lbs) 2   Horizontal ABduction 1 Right;20 reps;Weights   Horizontal ABduction 1 Weight (lbs) 2   Shoulder Exercises: Standing   Flexion Right;20 reps;Weights   Shoulder Flexion Weight (lbs) 2   Flexion Limitations to 90 degrees   ABduction Right;20 reps;Weights   Shoulder ABduction Weight (lbs) 1   ABduction Limitations to 90 degrees   Retraction Right;10 reps;Theraband   Theraband Level (Shoulder Retraction)  Level 2 (Red)   Retraction Limitations with 10 sec hold   Other Standing Exercises scaption (thumb up) with 1# on right; 2x10   Shoulder Exercises: Pulleys   Flexion 3 minutes   ABduction 3 minutes   ABduction Limitations abduction no limit   Other Pulley Exercises internal rotation x 2 min                  PT Short Term Goals - 12/02/15 0932    PT SHORT TERM GOAL #1   Title R Shoulder Flexion PROM to 140 by 11/20/15   Status Achieved   PT SHORT TERM GOAL #2   Title R Shoulder ER PROM to 45 at 60-80 ABD by 11/27/15   Status Achieved   PT SHORT TERM GOAL #3   Title pt able to sleep in bed and without limitation by shoulder pain by 12/04/15   Status On-going           PT Long Term Goals - 11/04/15 0750    PT LONG TERM GOAL #1   Title R Shoulder AROM at least: Flexion 155, ER 80 in 80-90 ABD, Scaption 160, and IR reach to L5 by 01/08/16   Status On-going   PT LONG TERM GOAL #2   Title R Shoulder MMT 4/5 or better all planes by 01/08/16   Status On-going   PT LONG TERM  GOAL #3   Title pt able to return to work at full duty or mildly modified duty (may have to limit overhead activities longer) by 01/08/16     Status On-going   PT LONG TERM GOAL #4   Title Pt independent with HEP for continued progression as necessary by 01/08/16   Status On-going               Plan - 12/06/15 0857    Clinical Impression Statement Pt tolerated increased strengthening exercises today and able to tolerate advancement in protocol without increase in pain.  Empty can scaption exercises was only exercise that pt unable to perform without substitution and shoulder shrug so deferred at this time.  Will continue to benefit from PT to maximize function.   PT Next Visit Plan progress as protocol allows; progressive strengthening exercises   Consulted and Agree with Plan of Care Patient      Patient will benefit from skilled therapeutic intervention in order to improve the following deficits  and impairments:     Visit Diagnosis: Pain in right shoulder  Stiffness of right shoulder, not elsewhere classified     Problem List There are no active problems to display for this patient.  Clarita CraneStephanie F Kishan Wachsmuth, PT, DPT 12/06/2015 8:59 AM  [facility] [place] [address]  [address] [place], [place], [address] Phone: [phone]   Fax:  [phone]  Name: William [name] MRN: [id number] Date of Birth: 11/18/1970

## 2015-12-10 ENCOUNTER — Ambulatory Visit: Payer: Managed Care, Other (non HMO)

## 2015-12-10 DIAGNOSIS — M25511 Pain in right shoulder: Secondary | ICD-10-CM

## 2015-12-10 DIAGNOSIS — M25611 Stiffness of right shoulder, not elsewhere classified: Secondary | ICD-10-CM

## 2015-12-10 NOTE — Therapy (Signed)
[facility] [place] [address]  [address] [place], [place], [address] Phone: [phone]   Fax:  [phone]  Physical Therapy Treatment  Patient Details  Name: William BarrDonald W Jimenez MRN: [id number] Date of Birth: 12/17/1970 Referring Provider: Georgena SpurlingStephen Lucey   Encounter Date: 12/10/2015      PT End of Session - 12/10/15 0903    Visit Number 12   Number of Visits 20   Date for PT Re-Evaluation 01/08/16   PT Start Time 0852   PT Stop Time 0933   PT Time Calculation (min) 41 min   Activity Tolerance Patient tolerated treatment well;No increased pain   Behavior During Therapy [facility] for tasks assessed/performed      History reviewed. No pertinent past medical history.  Past Surgical History  Procedure Laterality Date  . Bunionectomy Right 2011    There were no vitals filed for this visit.      Subjective Assessment - 12/10/15 0906    Subjective Pt. reports he currently has mild discomfort in the R shoulder however reports, "no actual pain".     Currently in Pain? No/denies   Pain Score 0-No pain   Multiple Pain Sites No            [facility] PT Assessment - 12/10/15 0001    AROM   Overall AROM Comments performed full AROM   AROM Assessment Site Shoulder   Right/Left Shoulder Right   Right Shoulder Flexion 169 Degrees   Right Shoulder ABduction 157 Degrees   Right Shoulder Internal Rotation 76 Degrees   Right Shoulder External Rotation 87 Degrees      Today's Treatment:  Therex: UBE: 1.5 level, 2 min forward/back each   Pulleys:       R shoulder flexion x 1 min       R shoulder abduction x 1 min        R shoulder IR x 1 min    Manual:  R shoulder PROM all direction R shoulder posterior/caudal mobs   Therex: Leaning on 1/2 white foam bolster on wall:       B shoulder flexion with 1 # x 15 reps        B shoulder scaption with 1# x 15 reps            B shoulder flexion with 2# x 10 reps  BATCA low row 20# x 15  reps B BATCA single arm low row 15# x 10 reps   ROM assessment  Vasoneumatic device to R shoulder: Seated in ~ 30 abd, coldest temp., medium compression, 10 min         PT Short Term Goals - 12/02/15 0932    PT SHORT TERM GOAL #1   Title R Shoulder Flexion PROM to 140 by 11/20/15   Status Achieved   PT SHORT TERM GOAL #2   Title R Shoulder ER PROM to 45 at 60-80 ABD by 11/27/15   Status Achieved   PT SHORT TERM GOAL #3   Title pt able to sleep in bed and without limitation by shoulder pain by 12/04/15   Status On-going           PT Long Term Goals - 11/04/15 0750    PT LONG TERM GOAL #1   Title R Shoulder AROM at least: Flexion 155, ER 80 in 80-90 ABD, Scaption 160, and IR reach to L5 by 01/08/16   Status On-going   PT LONG TERM GOAL #2  Title R Shoulder MMT 4/5 or better all planes by 01/08/16   Status On-going   PT LONG TERM GOAL #3   Title pt able to return to work at full duty or mildly modified duty (may have to limit overhead activities longer) by 01/08/16     Status On-going   PT LONG TERM GOAL #4   Title Pt independent with HEP for continued progression as necessary by 01/08/16   Status On-going               Plan - 12/10/15 0903    Clinical Impression Statement Pt. reports he currently has mild discomfort in the R shoulder however reports, "no actual pain".  Pt. tolerated all scapular and conservative R shoulder strengthening activity well today with no pain reported at R shoulder.  Pt. able to demo greatly improved R shoulder AROM of: 169 dg flex, 157 dg abd, 76 dg IR, 66 dg ER.  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     PT Treatment/Interventions Manual techniques;Therapeutic exercise;Vasopneumatic Device;Taping;Therapeutic activities;Electrical Stimulation;Cryotherapy;Patient/family education   PT Next Visit Plan progress as protocol allows; progressive strengthening exercises      Patient will benefit from skilled therapeutic  intervention in order to improve the following deficits and impairments:  Pain, Decreased strength, Decreased mobility, Decreased range of motion, Impaired UE functional use  Visit Diagnosis: Pain in right shoulder  Stiffness of right shoulder, not elsewhere classified     Problem List There are no active problems to display for this patient.   Kermit BaloMicah Matthieu Loftus, PTA 12/10/2015, 1:19 PM  [facility] [place] [address]  [address] [place], [place], [address] Phone: [phone]   Fax:  [phone]  Name: William [name] MRN: [id number] Date of Birth: 03/05/1971

## 2015-12-13 ENCOUNTER — Ambulatory Visit: Payer: Managed Care, Other (non HMO) | Admitting: Physical Therapy

## 2015-12-13 DIAGNOSIS — M25511 Pain in right shoulder: Secondary | ICD-10-CM

## 2015-12-13 DIAGNOSIS — M25611 Stiffness of right shoulder, not elsewhere classified: Secondary | ICD-10-CM
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
## 2015-12-16 ENCOUNTER — Ambulatory Visit: Payer: Managed Care, Other (non HMO) | Attending: Orthopedic Surgery | Admitting: Physical Therapy

## 2015-12-16 DIAGNOSIS — M25611 Stiffness of right shoulder, not elsewhere classified: Secondary | ICD-10-CM | POA: Diagnosis present

## 2015-12-16 DIAGNOSIS — M25511 Pain in right shoulder: Secondary | ICD-10-CM | POA: Insufficient documentation

## 2015-12-16 MED FILL — traMADol HCL 50 MG TABS: 50 | 15 days supply | Qty: 60 | Fill #1

## 2015-12-16 NOTE — Therapy (Signed)
[facility] [place] [address]  [address] [place], [place], [address] Phone: [phone]   Fax:  [phone]  Physical Therapy Treatment  Patient Details  Name: William [name] W Partridge MRN: [id number] Date of Birth: 02/21/1971 Referring Provider: Georgena SpurlingStephen Lucey   Encounter Date: 12/16/2015      PT End of Session - 12/16/15 0838    Visit Number 14   Number of Visits 20   Date for PT Re-Evaluation 01/08/16   PT Start Time 0759   PT Stop Time 0853   PT Time Calculation (min) 54 min   Activity Tolerance Patient tolerated treatment well;No increased pain   Behavior During Therapy [facility] for tasks assessed/performed      No past medical history on file.  Past Surgical History  Procedure Laterality Date  . Bunionectomy Right 2011    There were no vitals filed for this visit.      Subjective Assessment - 12/16/15 0800    Subjective Shoulder feels good; had some some soreness in scapula but says it's due to building a stand over the weekend.   Currently in Pain? No/denies                         [facility] Adult PT Treatment/Exercise - 12/16/15 0801    Shoulder Exercises: Prone   Other Prone Exercises elbow ext 6# RUE 2x15   Shoulder Exercises: ROM/Strengthening   UBE (Upper Arm Bike) L3.5 x 8 min; 4' fwd/4' bwd   Cybex Row Limitations 45# 2x10 both grips   Other ROM/Strengthening Exercises TRX: chest press 2x15; low row 2x15; deltoid raise 2x15; bicep curls 2x15;    Vasopneumatic   Number Minutes Vasopneumatic  15 minutes   Vasopnuematic Location  Shoulder   Vasopneumatic Pressure Low   Vasopneumatic Temperature  max cold                  PT Short Term Goals - 12/13/15 0823    PT SHORT TERM GOAL #1   Title R Shoulder Flexion PROM to 140 by 11/20/15   Status Achieved   PT SHORT TERM GOAL #2   Title R Shoulder ER PROM to 45 at 60-80 ABD by 11/27/15   Status Achieved   PT SHORT TERM GOAL #3   Title pt able to  sleep in bed and without limitation by shoulder pain by 12/04/15   Status Achieved           PT Long Term Goals - 11/04/15 0750    PT LONG TERM GOAL #1   Title R Shoulder AROM at least: Flexion 155, ER 80 in 80-90 ABD, Scaption 160, and IR reach to L5 by 01/08/16   Status On-going   PT LONG TERM GOAL #2   Title R Shoulder MMT 4/5 or better all planes by 01/08/16   Status On-going   PT LONG TERM GOAL #3   Title pt able to return to work at full duty or mildly modified duty (may have to limit overhead activities longer) by 01/08/16     Status On-going   PT LONG TERM GOAL #4   Title Pt independent with HEP for continued progression as necessary by 01/08/16   Status On-going               Plan - 12/16/15 0839    Clinical Impression Statement Pt tolerates strengthening exercises well without increased pain.  Will continue to benefit from PT to  maximize function.   PT Next Visit Plan progress as protocol allows; progressive strengthening exercises   Consulted and Agree with Plan of Care Patient      Patient will benefit from skilled therapeutic intervention in order to improve the following deficits and impairments:     Visit Diagnosis: Pain in right shoulder  Stiffness of right shoulder, not elsewhere classified     Problem List There are no active problems to display for this patient.  Clarita CraneStephanie F David Rodriquez, PT, DPT 12/16/2015 8:54 AM  [facility] [place] [address]  [address] [place], [place], [address] Phone: [phone]   Fax:  [phone]  Name: William [name] MRN: [id number] Date of Birth: 04/16/1971

## 2015-12-19 ENCOUNTER — Ambulatory Visit: Payer: Managed Care, Other (non HMO)

## 2015-12-19 DIAGNOSIS — M25611 Stiffness of right shoulder, not elsewhere classified: Secondary | ICD-10-CM

## 2015-12-19 DIAGNOSIS — M25511 Pain in right shoulder: Secondary | ICD-10-CM

## 2015-12-19 NOTE — Therapy (Signed)
[facility] [place] [address]  [address] [place], [place], [address] Phone: [phone]   Fax:  [phone]  Physical Therapy Treatment  Patient Details  Name: William BarrDonald W Stapel MRN: [id number] Date of Birth: 03/05/1971 Referring Provider: Georgena SpurlingStephen Lucey  Encounter Date: 12/19/2015      PT End of Session - 12/19/15 0812    Visit Number 15   Number of Visits 20   Date for PT Re-Evaluation 01/08/16   PT Start Time 0806   PT Stop Time 0902   PT Time Calculation (min) 56 min   Activity Tolerance Patient tolerated treatment well;No increased pain   Behavior During Therapy [facility] for tasks assessed/performed      History reviewed. No pertinent past medical history.  Past Surgical History  Procedure Laterality Date  . Bunionectomy Right 2011    There were no vitals filed for this visit.      Subjective Assessment - 12/19/15 0808    Subjective Pt. reports he is mildly sore in the R shoulder today however unable to assign a number to soreness; pt. reports he was mildly sore following last treatment's therex however it quickly subsided the next day.     Currently in Pain? No/denies   Pain Score 0-No pain   Multiple Pain Sites No      Today's Treatment:  Therex: NuStep: level 4, 3 min (just arms) UBE: 2 min forward/2 min back, level 2.5 BATCA pulldowns 35# x 15 reps  BATCA low row 45# x 15 reps B scapular retraction with black TB 2 x 10 reps  B scapular retraction / extension with blue TB x 10 reps  B scapular retraction / ER with yellow TB x 10 reps  Next to 6" bolster standing next to wall:        B shoulder scaption 1# x 10 reps         B shoulder scaption 1# x 10 reps       Laying on 6" bolster:        Supine cross chest stretch x 1 min         B shoulder ER with red TB x 15 reps         B shoulder protraction 6# dumbbells x 15 reps         B shoulder ER x 10 reps        R shoulder IR with green TB 2 x 15 reps   ROM  assessment  Vasoneumatic device to R shoulder seated in ~ 30 abd, coldest temp., medium compression, 10 min      [facility] PT Assessment - 12/19/15 0001    Assessment   Medical Diagnosis s/p R RCR   Referring Provider Georgena SpurlingStephen Lucey   Hand Dominance Right   Next MD Visit 12/31/15   AROM   Overall AROM Comments performed full AROM   AROM Assessment Site Shoulder   Right/Left Shoulder Right   Right Shoulder Flexion 165 Degrees   Right Shoulder ABduction 171 Degrees   Right Shoulder Internal Rotation 76 Degrees   Right Shoulder External Rotation 92 Degrees           PT Short Term Goals - 12/13/15 0823    PT SHORT TERM GOAL #1   Title R Shoulder Flexion PROM to 140 by 11/20/15   Status Achieved   PT SHORT TERM GOAL #2   Title R Shoulder ER PROM to 45 at 60-80 ABD by 11/27/15  Status Achieved   PT SHORT TERM GOAL #3   Title pt able to sleep in bed and without limitation by shoulder pain by 12/04/15   Status Achieved           PT Long Term Goals - 11/04/15 0750    PT LONG TERM GOAL #1   Title R Shoulder AROM at least: Flexion 155, ER 80 in 80-90 ABD, Scaption 160, and IR reach to L5 by 01/08/16   Status On-going   PT LONG TERM GOAL #2   Title R Shoulder MMT 4/5 or better all planes by 01/08/16   Status On-going   PT LONG TERM GOAL #3   Title pt able to return to work at full duty or mildly modified duty (may have to limit overhead activities longer) by 01/08/16     Status On-going   PT LONG TERM GOAL #4   Title Pt independent with HEP for continued progression as necessary by 01/08/16   Status On-going               Plan - 12/19/15 0813    Clinical Impression Statement Pt. reports he is mildly sore in the R shoulder today however unable to assign a number to soreness; pt. reports he was mildly sore following last treatment's therex however it quickly subsided the next day.  Today's treatment focused on continued machine resisted scapular strengthening and progression to  higher resistance of TB scapular strengthening activities; pt. tolerated all therex well with only R muscular fatigue reported.  R shoulder AROM taken today with pt. able to demo improvemet: FLEX 165dg, ABD 171 dg, IR 76dg, ER 92 dg.  Pt. getting stronger and progressing toward goals very well at this point.  Pt. to see MD for f/u regarding R shoulder status on 12/31/15.         PT Treatment/Interventions Manual techniques;Therapeutic exercise;Vasopneumatic Device;Taping;Therapeutic activities;Electrical Stimulation;Cryotherapy;Patient/family education   PT Next Visit Plan progress as protocol allows; progressive strengthening exercises      Patient will benefit from skilled therapeutic intervention in order to improve the following deficits and impairments:  Pain, Decreased strength, Decreased mobility, Decreased range of motion, Impaired UE functional use  Visit Diagnosis: Pain in right shoulder  Stiffness of right shoulder, not elsewhere classified     Problem List There are no active problems to display for this patient.   Kermit BaloMicah TRUE Shackleford, PTA 12/19/2015, 9:19 AM  [facility] [place] [address]  [address] [place], [place], [address] Phone: [phone]   Fax:  [phone]  Name: William BarrDonald W [name] MRN: [id number] Date of Birth: 05/12/1971

## 2015-12-23 ENCOUNTER — Ambulatory Visit: Payer: Managed Care, Other (non HMO) | Admitting: Physical Therapy

## 2015-12-23 DIAGNOSIS — M25511 Pain in right shoulder: Secondary | ICD-10-CM | POA: Diagnosis not present

## 2015-12-23 DIAGNOSIS — M25611 Stiffness of right shoulder, not elsewhere classified: Secondary | ICD-10-CM

## 2015-12-23 NOTE — Therapy (Signed)
[facility] [address]  [address] [place], [place], [address] Phone: [phone]   Fax:  [phone]  Physical Therapy Treatment  Patient Details  Name: William [name] MRN: [id number] Date of Birth: 1970/11/21 Referring Provider: Georgena Spurling  Encounter Date: 12/23/2015      PT End of Session - 12/23/15 0836    Visit Number 16   Number of Visits 20   Date for PT Re-Evaluation 01/08/16   PT Start Time 0759   PT Stop Time 0856   PT Time Calculation (min) 57 min   Activity Tolerance Patient tolerated treatment well   Behavior During Therapy [facility] for tasks assessed/performed      No past medical history on file.  Past Surgical History  Procedure Laterality Date  . Bunionectomy Right 2011    There were no vitals filed for this visit.      Subjective Assessment - 12/23/15 0836    Subjective doing well; went tubing yesterday so he's sore but otherwise no pain.  back to work today.   Currently in Pain? No/denies                         [facility] Adult PT Treatment/Exercise - 12/23/15 0811    Shoulder Exercises: Supine   Protraction Right;20 reps;Weights   Protraction Weight (lbs) 6   Shoulder Exercises: Prone   Retraction Right;20 reps   Retraction Weight (lbs) 6   Extension Right;20 reps;Weights   Extension Weight (lbs) 3   Horizontal ABduction 1 Right;20 reps;Weights   Horizontal ABduction 1 Weight (lbs) 3   Other Prone Exercises elbow ext 6# RUE 2x15   Shoulder Exercises: Standing   Shoulder Elevation Right;Standing   Shoulder Elevation Limitations 6# x20   Shoulder Exercises: ROM/Strengthening   UBE (Upper Arm Bike) L 4 x 6 min (3' fwd/3' bwd)   Cybex Row Limitations 25# RUE only 2x10 both grips   Other ROM/Strengthening Exercises TRX: chest press 2x10; low row 2x10; deltoid raise 2x10; bicep curls 2x10; tricep press 2x10                  PT Short Term Goals - 12/13/15 0823    PT SHORT TERM GOAL #1   Title R Shoulder Flexion PROM to 140 by 11/20/15   Status Achieved   PT SHORT TERM GOAL #2   Title R Shoulder ER PROM to 45 at 60-80 ABD by 11/27/15   Status Achieved   PT SHORT TERM GOAL #3   Title pt able to sleep in bed and without limitation by shoulder pain by 12/04/15   Status Achieved           PT Long Term Goals - 11/04/15 0750    PT LONG TERM GOAL #1   Title R Shoulder AROM at least: Flexion 155, ER 80 in 80-90 ABD, Scaption 160, and IR reach to L5 by 01/08/16   Status On-going   PT LONG TERM GOAL #2   Title R Shoulder MMT 4/5 or better all planes by 01/08/16   Status On-going   PT LONG TERM GOAL #3   Title pt able to return to work at full duty or mildly modified duty (may have to limit overhead activities longer) by 01/08/16     Status On-going   PT LONG TERM GOAL #4   Title Pt independent with HEP for continued progression as necessary by 01/08/16   Status On-going  Plan - 12/23/15 0837    Clinical Impression Statement Pt contines to tolerate strengthening exercises well with only c/o fatigue and no pain.  Educated on posture and importance of good posture to decrease risk of reinjury.  Will continue to benefit from PT to maximize function.   PT Next Visit Plan progress as protocol allows; progressive strengthening exercises   Consulted and Agree with Plan of Care Patient      Patient will benefit from skilled therapeutic intervention in order to improve the following deficits and impairments:  Pain, Decreased strength, Decreased mobility, Decreased range of motion, Impaired UE functional use  Visit Diagnosis: Pain in right shoulder  Stiffness of right shoulder, not elsewhere classified     Problem List There are no active problems to display for this patient.  Clarita CraneStephanie F Sonny Poth, PT, DPT 12/23/2015 9:27 AM  [facility] [place] [facility] [address]  [address] [place], [place], [address] Phone: [phone]   Fax:  [phone]  Name: William [name] MRN: [id number] Date of Birth: 05/07/1971

## 2015-12-26 ENCOUNTER — Ambulatory Visit: Payer: Managed Care, Other (non HMO) | Admitting: Physical Therapy

## 2015-12-26 DIAGNOSIS — M25611 Stiffness of right shoulder, not elsewhere classified: Secondary | ICD-10-CM

## 2015-12-26 DIAGNOSIS — M25511 Pain in right shoulder: Secondary | ICD-10-CM

## 2015-12-26 NOTE — Therapy (Signed)
[facility] [address]  [address] [place], [place], [address] Phone: [phone]   Fax:  [phone]  Physical Therapy Treatment  Patient Details  Name: William [name] MRN: [id number] Date of Birth: [birth year] Referring Provider: Georgena Spurling  Encounter Date: 12/26/2015      PT End of Session - 12/26/15 0842    Visit Number 17   Number of Visits 20   Date for PT Re-Evaluation 01/08/16   PT Start Time 0800   PT Stop Time 0840   PT Time Calculation (min) 40 min   Activity Tolerance Patient tolerated treatment well   Behavior During Therapy [facility] for tasks assessed/performed      No past medical history on file.  Past Surgical History  Procedure Laterality Date  . Bunionectomy Right 2011    There were no vitals filed for this visit.      Subjective Assessment - 12/26/15 0802    Subjective went back to work - no shoulder complaints   Currently in Pain? No/denies                         [facility] Adult PT Treatment/Exercise - 12/26/15 0803    Shoulder Exercises: Prone   Retraction Right;20 reps   Retraction Weight (lbs) 4   Extension Right;20 reps;Weights   Extension Weight (lbs) 4   External Rotation Right;20 reps;Weights   External Rotation Weight (lbs) 4   External Rotation Limitations 90 degrees abdct   Horizontal ABduction 1 Right;20 reps;Weights   Horizontal ABduction 1 Weight (lbs) 4   Other Prone Exercises elbow ext 4# RUE 2x10 with 3 sec hold at full ext   Shoulder Exercises: Standing   Flexion Right;Weights;20 reps   Shoulder Flexion Weight (lbs) 4   ABduction Right;20 reps;Weights   Shoulder ABduction Weight (lbs) 4   Other Standing Exercises overhead tricep press 4# 2x10   Shoulder Exercises: ROM/Strengthening   UBE (Upper Arm Bike) L4 x 8 (4' fwd/4' bwd)   Other ROM/Strengthening Exercises TRX: chest press 2x10; low row 2x10; deltoid raise 2x10; bicep curls 2x10; tricep press 2x10                 PT Education - 12/26/15 0842    Education provided Yes   Education Details strengthening HEP   Person(s) Educated Patient   Methods Explanation;Demonstration;Handout   Comprehension Verbalized understanding          PT Short Term Goals - 12/13/15 0823    PT SHORT TERM GOAL #1   Title R Shoulder Flexion PROM to 140 by 11/20/15   Status Achieved   PT SHORT TERM GOAL #2   Title R Shoulder ER PROM to 45 at 60-80 ABD by 11/27/15   Status Achieved   PT SHORT TERM GOAL #3   Title pt able to sleep in bed and without limitation by shoulder pain by 12/04/15   Status Achieved           PT Long Term Goals - 11/04/15 0750    PT LONG TERM GOAL #1   Title R Shoulder AROM at least: Flexion 155, ER 80 in 80-90 ABD, Scaption 160, and IR reach to L5 by 01/08/16   Status On-going   PT LONG TERM GOAL #2   Title R Shoulder MMT 4/5 or better all planes by 01/08/16   Status On-going   PT LONG TERM GOAL #3   Title pt able to  return to work at full duty or mildly modified duty (may have to limit overhead activities longer) by 01/08/16     Status On-going   PT LONG TERM GOAL #4   Title Pt independent with HEP for continued progression as necessary by 01/08/16   Status On-going               Plan - 12/26/15 0842    Clinical Impression Statement Issued strengthening HEP for home; pt progressing well.  Continues to demonstrate R shoulder weakness but slowing improving strength.   PT Next Visit Plan progress as protocol allows; progressive strengthening exercises   Consulted and Agree with Plan of Care Patient      Patient will benefit from skilled therapeutic intervention in order to improve the following deficits and impairments:  Pain, Decreased strength, Decreased mobility, Decreased range of motion, Impaired UE functional use  Visit Diagnosis: Pain in right shoulder  Stiffness of right shoulder, not elsewhere classified     Problem List There are no active  problems to display for this patient.  Clarita CraneStephanie F Naw Lasala, PT, DPT 12/26/2015 8:44 AM  [facility] [address]  [address] [place], [place], [address] Phone: [phone]   Fax:  [phone]  Name: William [name] MRN: [id number] Date of Birth: 03/21/1971

## 2015-12-26 NOTE — Patient Instructions (Signed)
Extension - Prone (Dumbbell)    Lie with right arm hanging off side of bed. Lift hand back and up. Repeat __20-30__ times per set. Do __1__ sets per session. Do __5-7__ sessions per week. Use __2-4__ lb weight.   Copyright  VHI. All rights reserved.    Abduction: Horizontal - Prone (Dumbbell)    Lie with right arm hanging down. Lift arm out to side, thumb up. Repeat __20-30__ times per set. Do __1__ sets per session. Do _5-7___ sessions per week. Use __2-4__ lb weight.   Copyright  VHI. All rights reserved.   Scapular: Flexion (Prone)    Holding __2-4__ pound weights, raise right arms forward. Keep elbows straight. Repeat __20-30__ times per set. Do __1__ sets per session. Do _1___ sessions per day.  http://orth.exer.us/860   Copyright  VHI. All rights reserved.   Scapular: Retraction (Prone)    Holding _2-4___ pound weights, keep arms out from sides and elbows bent. Pull elbows back, pinching shoulder blades together. Repeat __20-30__ times per set. Do __1__ sets per session. Do __1__ sessions per day.  http://orth.exer.us/862   Copyright  VHI. All rights reserved.        External Rotation (Prone)    Lie with right upper arm straight out from body, elbow bent to 90, _2-4___ pound weight in hand. Rotate forearm up, keeping elbow bent. Return slowly. Repeat _20-30___ times per set. Do _1___ sets per session. Do __1__ sessions per day.  http://orth.exer.us/940   Copyright  VHI. All rights reserved.

## 2015-12-30 ENCOUNTER — Ambulatory Visit: Payer: Managed Care, Other (non HMO)

## 2015-12-30 DIAGNOSIS — M25511 Pain in right shoulder: Secondary | ICD-10-CM

## 2015-12-30 DIAGNOSIS — M25611 Stiffness of right shoulder, not elsewhere classified: Secondary | ICD-10-CM

## 2015-12-30 NOTE — Therapy (Signed)
[facility] [address]  [address] [place], [place], [address] Phone: [phone]   Fax:  [phone]  Physical Therapy Treatment  Patient Details  Name: William [name] MRN: [id number] Date of Birth: 08/02/1970 Referring Provider: Georgena SpurlingStephen Lucey  Encounter Date: 12/30/2015      PT End of Session - 12/30/15 0808    Visit Number 18   Number of Visits 20   Date for PT Re-Evaluation 01/08/16   PT Start Time 0804   PT Stop Time 0844   PT Time Calculation (min) 40 min   Activity Tolerance Patient tolerated treatment well   Behavior During Therapy [facility] for tasks assessed/performed      History reviewed. No pertinent past medical history.  Past Surgical History  Procedure Laterality Date  . Bunionectomy Right 2011    There were no vitals filed for this visit.      Subjective Assessment - 12/30/15 0807    Subjective Pt. reports his R shoulder has performed well at work while on light duty so far.  No pain or complaints reported initially today.     Currently in Pain? No/denies   Pain Score 0-No pain   Multiple Pain Sites No     Today's Treatment:  Therex:  UBE: 1.5 level, 2 min forwards/ 2 min backwards Prone on mat table:         R horizontal abduction with #4 x 20 reps          R shoulder extension with 4# 3" x 20 reps          R shoulder ER with 4# x 15 reps Supine laying on  foam bolster:        B shoulder horizontal abduction with black TB x 20 reps         Alternating shoulder flexion / extension with black TB x 10 reps each way  BATCA pulldown 55# x 15 reps  BATCA low row 45# x 15 reps BATCA chest press 35# x 15 reps TRX chest press 2 x 10 reps Leaning on 1/2 foam bolster on wall:         Empty can B shoulder flexion 2# 2 x 10 reps         B shoulder scaption 2# 2 x 10 reps   Doorway stretch x 30 sec  R shoulder D1 flexion x 15 reps  R shoulder D 2 flexion x 15 reps       [facility] PT Assessment -  12/30/15 1309    ROM / Strength   AROM / PROM / Strength Strength   AROM   Overall AROM Comments performed full AROM   AROM Assessment Site Shoulder   Right/Left Shoulder Right   Right Shoulder Flexion 165 Degrees   Right Shoulder ABduction 171 Degrees   Right Shoulder Internal Rotation 76 Degrees   Right Shoulder External Rotation 92 Degrees   Strength   Strength Assessment Site Shoulder   Right/Left Shoulder Right   Right Shoulder Flexion 4+/5   Right Shoulder ABduction 4+/5   Right Shoulder Internal Rotation 4+/5   Right Shoulder External Rotation 4+/5           PT Short Term Goals - 12/13/15 0823    PT SHORT TERM GOAL #1   Title R Shoulder Flexion PROM to 140 by 11/20/15   Status Achieved   PT SHORT TERM GOAL #2   Title R Shoulder ER PROM to 45 at  60-80 ABD by 11/27/15   Status Achieved   PT SHORT TERM GOAL #3   Title pt able to sleep in bed and without limitation by shoulder pain by 12/04/15   Status Achieved           PT Long Term Goals - 12/30/15 1309    PT LONG TERM GOAL #1   Title R Shoulder AROM at least: Flexion 155, ER 80 in 80-90 ABD, Scaption 160, and IR reach to L5 by 01/08/16   Status Achieved   PT LONG TERM GOAL #2   Title R Shoulder MMT 4/5 or better all planes by 01/08/16   Status Achieved   PT LONG TERM GOAL #3   Title pt able to return to work at full duty or mildly modified duty (may have to limit overhead activities longer) by 01/08/16     Status On-going   PT LONG TERM GOAL #4   Title Pt independent with HEP for continued progression as necessary by 01/08/16   Status On-going               Plan - 12/30/15 0808    Clinical Impression Statement Pt. reports his R shoulder has performed well at work while on light duty so far.  No pain or complaints reported initially today.  Pt. tolerated advancement of all R shoulder and scapular strengthening activity well today without pain at R shoulder; pt. able to demo improved R shoulder AROM today  and able to demo grossly 4+/5 R shoulder strength in all motions.  Pt. progressing toward established goals very well at this point.      PT Treatment/Interventions Manual techniques;Therapeutic exercise;Vasopneumatic Device;Taping;Therapeutic activities;Electrical Stimulation;Cryotherapy;Patient/family education   PT Next Visit Plan progress as protocol allows; progressive strengthening exercises      Patient will benefit from skilled therapeutic intervention in order to improve the following deficits and impairments:  Pain, Decreased strength, Decreased mobility, Decreased range of motion, Impaired UE functional use  Visit Diagnosis: Pain in right shoulder  Stiffness of right shoulder, not elsewhere classified     Problem List There are no active problems to display for this patient.   William [name], William [name] 12/30/2015, 1:17 PM  [facility] [address]  [address] [place], [place], [address] Phone: [phone]   Fax:  [phone]  Name: William [name] MRN: [id number] Date of Birth: 09-17-1970

## 2016-01-02 ENCOUNTER — Ambulatory Visit: Payer: Managed Care, Other (non HMO) | Admitting: Physical Therapy

## 2016-01-02 DIAGNOSIS — M25511 Pain in right shoulder: Secondary | ICD-10-CM

## 2016-01-02 DIAGNOSIS — M25611 Stiffness of right shoulder, not elsewhere classified: Secondary | ICD-10-CM

## 2016-01-02 NOTE — Therapy (Signed)
[facility] [address]  [address] [place], [place], [address] Phone: [phone]   Fax:  [phone]  Physical Therapy Treatment  Patient Details  Name: Anell BarrDonald W Bazaldua MRN: [id number] Date of Birth: 05/23/1971 Referring Provider: Georgena SpurlingStephen Lucey  Encounter Date: 01/02/2016      PT End of Session - 01/02/16 0839    Visit Number 19   Number of Visits 20   Date for PT Re-Evaluation 01/08/16   PT Start Time 0800   PT Stop Time 0855   PT Time Calculation (min) 55 min   Activity Tolerance Patient tolerated treatment well   Behavior During Therapy [facility] for tasks assessed/performed      No past medical history on file.  Past Surgical History  Procedure Laterality Date  . Bunionectomy Right 2011    There were no vitals filed for this visit.      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 No/denies                         [facility] Adult PT Treatment/Exercise - 01/02/16 0804    Shoulder Exercises: Supine   Protraction Right;15 reps;Weights   Protraction Weight (lbs) 8   Protraction Limitations 2 sets   Flexion Right;15 reps;Weights   Shoulder Flexion Weight (lbs) 4   Flexion Limitations 2 sets   Shoulder Exercises: Prone   Extension Right;15 reps   Extension Weight (lbs) 4   Extension Limitations 2 sets   Horizontal ABduction 1 Right;15 reps;Weights   Horizontal ABduction 1 Weight (lbs) 4   Horizontal ABduction 1 Limitations 2 sets   Shoulder Exercises: Sidelying   External Rotation Right;15 reps;Weights   External Rotation Weight (lbs) 4   External Rotation Limitations 2 sets   ABduction Right;15 reps;Weights   ABduction Weight (lbs) 3   ABduction Limitations 2 sets   Shoulder Exercises: ROM/Strengthening   UBE (Upper Arm Bike) L4.5 x 8 (4' fwd/4' bwd)   Cybex  Row Limitations 45# 2x15 both grips   Other ROM/Strengthening Exercises TRX: chest press 2x10; low row 2x10; deltoid raise 2x10; bicep curls 2x10; tricep press 2x10   Modalities   Modalities Vasopneumatic   Vasopneumatic   Number Minutes Vasopneumatic  15 minutes   Vasopnuematic Location  Shoulder   Vasopneumatic Pressure Low   Vasopneumatic Temperature  max cold                  PT Short Term Goals - 12/13/15 0823    PT SHORT TERM GOAL #1   Title R Shoulder Flexion PROM to 140 by 11/20/15   Status Achieved   PT SHORT TERM GOAL #2   Title R Shoulder ER PROM to 45 at 60-80 ABD by 11/27/15   Status Achieved   PT SHORT TERM GOAL #3   Title pt able to sleep in bed and without limitation by shoulder pain by 12/04/15   Status Achieved           PT Long Term Goals - 12/30/15 1309    PT LONG TERM GOAL #1   Title R Shoulder AROM at least: Flexion 155, ER 80 in 80-90 ABD, Scaption 160, and IR reach to L5 by 01/08/16   Status Achieved   PT LONG TERM GOAL #2  Title R Shoulder MMT 4/5 or better all planes by 01/08/16   Status Achieved   PT LONG TERM GOAL #3   Title pt able to return to work at full duty or mildly modified duty (may have to limit overhead activities longer) by 01/08/16     Status On-going   PT LONG TERM GOAL #4   Title Pt independent with HEP for continued progression as necessary by 01/08/16   Status On-going               Plan - 01/02/16 0839    Clinical Impression Statement Pt progressing well with PT; anticipate renewal next week as pt still with R shoulder weakness and will need 5/5 strength in order to return to work at full duty.  Will continue to benefit from PT to maximize function.   PT Next Visit Plan  progressive strengthening exercises   Consulted and Agree with Plan of Care Patient      Patient will benefit from skilled therapeutic intervention in order to improve the following deficits and impairments:  Pain, Decreased strength, Decreased  mobility, Decreased range of motion, Impaired UE functional use  Visit Diagnosis: Pain in right shoulder  Stiffness of right shoulder, not elsewhere classified     Problem List There are no active problems to display for this patient.  [name], PT, DPT 01/02/2016 9:31 AM  [facility] [address]  [address] [place], [place], [address] Phone: [phone]   Fax:  [phone]  Name: MILEN LENGACHER MRN: [id number] Date of Birth: 27-Dec-1970

## 2016-01-07 ENCOUNTER — Ambulatory Visit: Payer: Managed Care, Other (non HMO) | Admitting: Physical Therapy

## 2016-01-07 DIAGNOSIS — M25511 Pain in right shoulder: Secondary | ICD-10-CM

## 2016-01-07 DIAGNOSIS — M25611 Stiffness of right shoulder, not elsewhere classified: Secondary | ICD-10-CM

## 2016-01-07 NOTE — Therapy (Signed)
[facility] [address]  [address] [place], [place], [address] Phone: [phone]   Fax:  [phone]  Physical Therapy Treatment  Patient Details  Name: William [name] MRN: [id number] Date of Birth: 1971/04/15 Referring Provider: Georgena Spurling  Encounter Date: 01/07/2016      PT End of Session - 01/07/16 0836    Visit Number 20   Number of Visits 20   Date for PT Re-Evaluation 01/08/16   PT Start Time 0757   PT Stop Time 0845   PT Time Calculation (min) 48 min   Activity Tolerance Patient tolerated treatment well   Behavior During Therapy [facility] for tasks assessed/performed      No past medical history on file.  Past Surgical History:  Procedure Laterality Date  . BUNIONECTOMY Right 2011    There were no vitals filed for this visit.      Subjective Assessment - 01/07/16 0800    Subjective shoulder is doing well; swept water off concrete pad yesterday without any pain   Currently in Pain? No/denies                         [facility] Adult PT Treatment/Exercise - 01/07/16 0800      Shoulder Exercises: Prone   Retraction Right;15 reps;Weights   Retraction Weight (lbs) 4   Retraction Limitations 2 sets; on physioball   Horizontal ABduction 1 Right;15 reps;Weights   Horizontal ABduction 1 Weight (lbs) 4   Horizontal ABduction 1 Limitations 2 sets; on physioball   Other Prone Exercises elbow ext 4# RUE 2x15 on physioball; 2x15 on mat table with 3 sec hold     Shoulder Exercises: Standing   Flexion Right;15 reps;Weights   Shoulder Flexion Weight (lbs) 4   Flexion Limitations 2 sets   Other Standing Exercises scaption (empty and full can) 4# 2x15     Shoulder Exercises: ROM/Strengthening   UBE (Upper Arm Bike) L4.5 x 8 (4' fwd/4' bwd)   Cybex Row Limitations 45# 2x15 both grips   Wall Pushups 15 reps   Pushups Limitations counter height x 15; mat table x 15   Plank 30 seconds;2 reps   Plank  Limitations min cues for technique     Shoulder Exercises: Stretch   Other Shoulder Stretches 3 way doorway stretch 2x30 sec     Vasopneumatic   Number Minutes Vasopneumatic  10 minutes   Vasopnuematic Location  Shoulder   Vasopneumatic Pressure Low   Vasopneumatic Temperature  max cold                  PT Short Term Goals - 12/13/15 0823      PT SHORT TERM GOAL #1   Title R Shoulder Flexion PROM to 140 by 11/20/15   Status Achieved     PT SHORT TERM GOAL #2   Title R Shoulder ER PROM to 45 at 60-80 ABD by 11/27/15   Status Achieved     PT SHORT TERM GOAL #3   Title pt able to sleep in bed and without limitation by shoulder pain by 12/04/15   Status Achieved           PT Long Term Goals - 12/30/15 1309      PT LONG TERM GOAL #1   Title R Shoulder AROM at least: Flexion 155, ER 80 in 80-90 ABD, Scaption 160, and IR reach to L5 by 01/08/16   Status Achieved  PT LONG TERM GOAL #2   Title R Shoulder MMT 4/5 or better all planes by 01/08/16   Status Achieved     PT LONG TERM GOAL #3   Title pt able to return to work at full duty or mildly modified duty (may have to limit overhead activities longer) by 01/08/16     Status On-going     PT LONG TERM GOAL #4   Title Pt independent with HEP for continued progression as necessary by 01/08/16   Status On-going               Plan - 01/07/16 0837    Clinical Impression Statement Pt continues to demonstrate fatigue and weakness of R shoulder.  Will need renewal next visit to continue to progress strength in order to return to work at full duty.   PT Next Visit Plan  progressive strengthening exercises; assess strength and renewal   Consulted and Agree with Plan of Care Patient      Patient will benefit from skilled therapeutic intervention in order to improve the following deficits and impairments:  Pain, Decreased strength, Decreased mobility, Decreased range of motion, Impaired UE functional use  Visit  Diagnosis: Pain in right shoulder  Stiffness of right shoulder, not elsewhere classified     Problem List There are no active problems to display for this patient.  [name], PT, DPT 01/07/16 8:48 AM  [facility] [address]  [address] [place], [place], [address] Phone: [phone]   Fax:  [phone]  Name: William [name] MRN: [id number] Date of Birth: 06/04/1971

## 2016-01-09 ENCOUNTER — Ambulatory Visit: Payer: Managed Care, Other (non HMO) | Admitting: Physical Therapy

## 2016-01-09 DIAGNOSIS — M25511 Pain in right shoulder: Secondary | ICD-10-CM | POA: Diagnosis not present

## 2016-01-09 DIAGNOSIS — M25611 Stiffness of right shoulder, not elsewhere classified: Secondary | ICD-10-CM

## 2016-01-09 NOTE — Therapy (Signed)
[facility] [address]  [place], [place], [address] Phone: [phone]   Fax:  [phone]  Physical Therapy Treatment  Patient Details  Name: William [name] MRN: [id number] Date of Birth: [birth year] Referring Provider: Lara Mulch  Encounter Date: 01/09/2016      PT End of Session - 01/09/16 0837    Visit Number 21   Number of Visits 29   Date for PT Re-Evaluation 02/06/16   PT Start Time 0800   PT Stop Time 0854   PT Time Calculation (min) 54 min   Activity Tolerance Patient tolerated treatment well   Behavior During Therapy [facility] for tasks assessed/performed      No past medical history on file.  Past Surgical History:  Procedure Laterality Date  . BUNIONECTOMY Right 2011    There were no vitals filed for this visit.      Subjective Assessment - 01/09/16 0802    Subjective no complaints   Currently in Pain? No/denies            [facility] PT Assessment - 01/09/16 0808      AROM   Right Shoulder Flexion 161 Degrees   Right Shoulder ABduction 153 Degrees   Right Shoulder Internal Rotation --  to T11/12     Strength   Right Shoulder Flexion 3+/5   Right Shoulder ABduction 4/5   Right Shoulder Internal Rotation 5/5   Right Shoulder External Rotation 4/5                     [facility] Adult PT Treatment/Exercise - 01/09/16 0803      Shoulder Exercises: Prone   Extension Right;15 reps   Extension Weight (lbs) 5   Extension Limitations 2 sets   Other Prone Exercises elbow ext 5# 2x15     Shoulder Exercises: Standing   Flexion Right;15 reps;Weights   Shoulder Flexion Weight (lbs) 5   Flexion Limitations 2 sets   Other Standing Exercises scaption (full can) 5# 2x15     Shoulder Exercises: ROM/Strengthening   UBE (Upper Arm Bike) L6 x 8 (4' fwd/4' bwd)   Wall Pushups 15 reps   Pushups Limitations mat table x 30   Other ROM/Strengthening Exercises tricep press 20# 2x15      Vasopneumatic   Number Minutes Vasopneumatic  15 minutes   Vasopnuematic Location  Shoulder   Vasopneumatic Pressure Low   Vasopneumatic Temperature  max cold                PT Education - 01/09/16 0837    Education provided Yes   Education Details stretches   Person(s) Educated Patient   Methods Explanation;Demonstration;Handout   Comprehension Verbalized understanding;Returned demonstration          PT Short Term Goals - 12/13/15 0823      PT SHORT TERM GOAL #1   Title R Shoulder Flexion PROM to 140 by 11/20/15   Status Achieved     PT SHORT TERM GOAL #2   Title R Shoulder ER PROM to 45 at 60-80 ABD by 11/27/15   Status Achieved     PT SHORT TERM GOAL #3   Title pt able to sleep in bed and without limitation by shoulder pain by 12/04/15   Status Achieved           PT Long Term Goals - 01/09/16 0839      PT LONG TERM GOAL #1   Title R Shoulder  AROM at least: Flexion 155, ER 80 in 80-90 ABD, Scaption 160, and IR reach to L5 by 01/08/16   Status Achieved     PT LONG TERM GOAL #2   Title R Shoulder MMT 4/5 or better all planes by 01/08/16   Baseline R shoulder flexion still 3+/5   Status Partially Met     PT LONG TERM GOAL #3   Title pt able to return to work at full duty or mildly modified duty (may have to limit overhead activities longer) by 02/06/16   Status On-going     PT LONG TERM GOAL #4   Title Pt independent with HEP for continued progression as necessary by 02/06/16   Status On-going     PT LONG TERM GOAL #5   Title improve functional IR R shoulder to at least T8 for improved function and motion (02/06/16)   Time 4   Period Weeks   Status New     Additional Long Term Goals   Additional Long Term Goals Yes     PT LONG TERM GOAL #6   Title improve R shoulder strength to at least 4+/5 for improved function in anticipation of return to full work duty (02/06/16   Time [place] - 01/09/16 0841     Clinical Impression Statement Pt is progressing well but continues to demonstrate strength deficits limiting ability to return to full work duty.  Pt also c/o functional ir tightness and chest tightness affecting shoulder mobility.  Recommend additional PT 2x/wk x 4 weeks to address theses deficits and maximize function.   Rehab Potential Good   PT Frequency 2x / week   PT Duration 4 weeks   PT Treatment/Interventions Manual techniques;Therapeutic exercise;Vasopneumatic Device;Taping;Therapeutic activities;Electrical Stimulation;Cryotherapy;Patient/family education   PT Next Visit Plan  progressive strengthening exercises   Consulted and Agree with Plan of Care Patient      Patient will benefit from skilled therapeutic intervention in order to improve the following deficits and impairments:  Pain, Decreased strength, Decreased mobility, Decreased range of motion, Impaired UE functional use  Visit Diagnosis: Pain in right shoulder - Plan: PT plan of care cert/re-cert  Stiffness of right shoulder, not elsewhere classified - Plan: PT plan of care cert/re-cert     Problem List There are no active problems to display for this patient.    Laureen Abrahams, PT, DPT 01/09/16 9:29 AM  [facility] [place], [place], [address] Phone: [phone]   Fax:  [phone]  Name: William [name] MRN: [id number] Date of Birth: 28-Jun-1970

## 2016-01-09 NOTE — Patient Instructions (Signed)
   ROM: Towel Stretch - with Interior Rotation    Pull right arm up behind back by pulling towel up with other arm. Hold _20-30___ seconds. Repeat _3___ times per set. Do _1___ sets per session. Do __2-3__ sessions per day.  http://orth.exer.us/888   Copyright  VHI. All rights reserved.     CHEST: Doorway, Bilateral - Standing    Standing in doorway, place hands on wall with elbows bent at shoulder height. Lean forward. Hold _20-30__ seconds.  Perform with arms down, at shoulder height, and arms up. _2-3__ reps per set, _2-3__ sets per day, _7__ days per week  Copyright  VHI. All rights reserved.

## 2016-01-14 ENCOUNTER — Ambulatory Visit: Payer: Managed Care, Other (non HMO) | Attending: Orthopedic Surgery | Admitting: Physical Therapy

## 2016-01-14 DIAGNOSIS — M25511 Pain in right shoulder: Secondary | ICD-10-CM | POA: Diagnosis not present

## 2016-01-14 DIAGNOSIS — M25611 Stiffness of right shoulder, not elsewhere classified: Secondary | ICD-10-CM | POA: Insufficient documentation

## 2016-01-14 NOTE — Therapy (Signed)
[facility] [address]  [place], [place], [address] Phone: [phone]   Fax:  [phone]  Physical Therapy Treatment  Patient Details  Name: William [name] MRN: [id number] Date of Birth: [birth year] Referring Provider: Lara Mulch  Encounter Date: 01/14/2016      PT End of Session - 01/14/16 0839    Visit Number 22   Number of Visits 29   Date for PT Re-Evaluation 02/06/16   PT Start Time 0800   PT Stop Time 0839   PT Time Calculation (min) 39 min   Activity Tolerance Patient tolerated treatment well   Behavior During Therapy [facility] for tasks assessed/performed      No past medical history on file.  Past Surgical History:  Procedure Laterality Date  . BUNIONECTOMY Right 2011    There were no vitals filed for this visit.      Subjective Assessment - 01/14/16 0802    Subjective doing well; shoulder feels "tired"   Currently in Pain? No/denies                         [facility] Adult PT Treatment/Exercise - 01/14/16 0802      Shoulder Exercises: ROM/Strengthening   UBE (Upper Arm Bike) L6 x 8 (4' fwd/4' bwd)   Cybex Row Limitations 55# 2x15 both grips   Sustained Retraction with Theraband blue theraband with 15 sec hold 3x10; with elbow extensioin   Other ROM/Strengthening Exercises TRX: deltoid raise 2x15; horizontal abdct 2x15     Shoulder Exercises: Stretch   Internal Rotation Stretch 3 reps  30 sec     Shoulder Exercises: Body Blade   Flexion 3 reps;45 seconds   ABduction 3 reps;45 seconds                  PT Short Term Goals - 12/13/15 0823      PT SHORT TERM GOAL #1   Title R Shoulder Flexion PROM to 140 by 11/20/15   Status Achieved     PT SHORT TERM GOAL #2   Title R Shoulder ER PROM to 45 at 60-80 ABD by 11/27/15   Status Achieved     PT SHORT TERM GOAL #3   Title pt able to sleep in bed and without limitation by shoulder pain by 12/04/15   Status Achieved            PT Long Term Goals - 01/09/16 0839      PT LONG TERM GOAL #1   Title R Shoulder AROM at least: Flexion 155, ER 80 in 80-90 ABD, Scaption 160, and IR reach to L5 by 01/08/16   Status Achieved     PT LONG TERM GOAL #2   Title R Shoulder MMT 4/5 or better all planes by 01/08/16   Baseline R shoulder flexion still 3+/5   Status Partially Met     PT LONG TERM GOAL #3   Title pt able to return to work at full duty or mildly modified duty (may have to limit overhead activities longer) by 02/06/16   Status On-going     PT LONG TERM GOAL #4   Title Pt independent with HEP for continued progression as necessary by 02/06/16   Status On-going     PT LONG TERM GOAL #5   Title improve functional IR R shoulder to at least T8 for improved function and motion (02/06/16)   Time 4   Period  Weeks   Status New     Additional Long Term Goals   Additional Long Term Goals Yes     PT LONG TERM GOAL #6   Title improve R shoulder strength to at least 4+/5 for improved function in anticipation of return to full work duty (02/06/16   Time 4   Period Weeks   Status New               Plan - 01/14/16 0840    Clinical Impression Statement Pt continues to tolerate strengthening exercises well today; slowly progressing towards strength goals.   PT Treatment/Interventions Manual techniques;Therapeutic exercise;Vasopneumatic Device;Taping;Therapeutic activities;Electrical Stimulation;Cryotherapy;Patient/family education   PT Next Visit Plan  progressive strengthening exercises   Consulted and Agree with Plan of Care Patient      Patient will benefit from skilled therapeutic intervention in order to improve the following deficits and impairments:  Pain, Decreased strength, Decreased mobility, Decreased range of motion, Impaired UE functional use  Visit Diagnosis: Pain in right shoulder  Stiffness of right shoulder, not elsewhere classified     Problem List There are no active  problems to display for this patient.    Laureen Abrahams, PT, DPT 01/14/16 8:41 AM    [facility] [address]  [place], [place], [address] Phone: [phone]   Fax:  [phone]  Name: William [name] MRN: [id number] Date of Birth: 02/26/1971

## 2016-01-16 ENCOUNTER — Ambulatory Visit: Payer: Managed Care, Other (non HMO)

## 2016-01-16 DIAGNOSIS — M25511 Pain in right shoulder: Secondary | ICD-10-CM

## 2016-01-16 DIAGNOSIS — M25611 Stiffness of right shoulder, not elsewhere classified: Secondary | ICD-10-CM

## 2016-01-16 NOTE — Therapy (Signed)
[facility] [address]  [place], [place], [address] Phone: [phone]   Fax:  [phone]  Physical Therapy Treatment  Patient Details  Name: William [name] MRN: [id number] Date of Birth: Apr 29, 1971 Referring Provider: Lara Mulch   Encounter Date: 01/16/2016      PT End of Session - 01/16/16 0826    Visit Number 23   Number of Visits 29   Date for PT Re-Evaluation 02/06/16   PT Start Time 0802   PT Stop Time 0841   PT Time Calculation (min) 39 min   Activity Tolerance Patient tolerated treatment well   Behavior During Therapy [facility] for tasks assessed/performed      No past medical history on file.  Past Surgical History:  Procedure Laterality Date  . BUNIONECTOMY Right 2011    There were no vitals filed for this visit.      Subjective Assessment - 01/16/16 0818    Subjective Pt. reports his shoulder feels good today; only complaint at this point is mild tightness in R pectoral muscle in evenings.   Currently in Pain? No/denies   Pain Score 0-No pain   Multiple Pain Sites No      Today's Treatment:  Therex: UBE 2 min forward/back, 3.0 R shoulder IR stretch x 30 sec  BATCA low row 45# 2 x 15 reps; 55# on 2nd set BATCA pulldown #55 reps 2 x 15 reps; 65# with 2nd set Standing scapular rectraction with black TB 5" x 10 reps  Standing scapular retraction / extension with blue TB 5" x 15 reps  Laying prone on mat:          I's x 15 reps 3#         T's x 15 reps 3#         Y's x 15 repes 3#  R sidelying R shoulder sleeper stretch into IR 2 x 1 min   Manual: Prone lying R shoulder anterior mobs  R shoulder IR stretch   Therex: Body blade R shoulder IR/ER x 1 min  Body blade B UE flexion/ extension x 1 min ~ 70 dg  Body blade R shoulder horizontal Abduction/adduction ~ 70 dg         PT Short Term Goals - 12/13/15 0823      PT SHORT TERM GOAL #1   Title R Shoulder Flexion PROM to 140 by  11/20/15   Status Achieved     PT SHORT TERM GOAL #2   Title R Shoulder ER PROM to 45 at 60-80 ABD by 11/27/15   Status Achieved     PT SHORT TERM GOAL #3   Title pt able to sleep in bed and without limitation by shoulder pain by 12/04/15   Status Achieved           PT Long Term Goals - 01/09/16 0839      PT LONG TERM GOAL #1   Title R Shoulder AROM at least: Flexion 155, ER 80 in 80-90 ABD, Scaption 160, and IR reach to L5 by 01/08/16   Status Achieved     PT LONG TERM GOAL #2   Title R Shoulder MMT 4/5 or better all planes by 01/08/16   Baseline R shoulder flexion still 3+/5   Status Partially Met     PT LONG TERM GOAL #3   Title pt able to return to work at full duty or mildly modified duty (may have to limit  overhead activities longer) by 02/06/16   Status On-going     PT LONG TERM GOAL #4   Title Pt independent with HEP for continued progression as necessary by 02/06/16   Status On-going     PT LONG TERM GOAL #5   Title improve functional IR R shoulder to at least T8 for improved function and motion (02/06/16)   Time 4   Period Weeks   Status New     Additional Long Term Goals   Additional Long Term Goals Yes     PT LONG TERM GOAL #6   Title improve R shoulder strength to at least 4+/5 for improved function in anticipation of return to full work duty (02/06/16   Time 4   Period Weeks   Status New               Plan - 01/16/16 1798    Clinical Impression Statement Pt. reports his shoulder feels good today; only complaint at this point is mild tightness in R pectoral muscle in evenings.  Today's treatment focused on scapular/shoulder stabilization with increased resistance on BATCA pull down and low row; body blade continued today in progressively difficult planes of movement.  Pt. continues to progress very well.      PT Treatment/Interventions Manual techniques;Therapeutic exercise;Vasopneumatic Device;Taping;Therapeutic activities;Electrical  Stimulation;Cryotherapy;Patient/family education   PT Next Visit Plan  progressive strengthening exercises      Patient will benefit from skilled therapeutic intervention in order to improve the following deficits and impairments:  Pain, Decreased strength, Decreased mobility, Decreased range of motion, Impaired UE functional use  Visit Diagnosis: Pain in right shoulder  Stiffness of right shoulder, not elsewhere classified     Problem List There are no active problems to display for this patient.   Bess Harvest, PTA 01/16/2016, 3:11 PM  [facility] [address]  [place], [place], [address] Phone: [phone]   Fax:  [phone]  Name: William [name] MRN: [id number] Date of Birth: Oct 23, 1970

## 2016-01-21 ENCOUNTER — Ambulatory Visit: Payer: Managed Care, Other (non HMO) | Admitting: Physical Therapy

## 2016-01-21 DIAGNOSIS — M25511 Pain in right shoulder: Secondary | ICD-10-CM

## 2016-01-21 DIAGNOSIS — M25611 Stiffness of right shoulder, not elsewhere classified: Secondary | ICD-10-CM

## 2016-01-21 NOTE — Therapy (Signed)
[facility] [address]  [place], [place], [address] Phone: [phone]   Fax:  [phone]  Physical Therapy Treatment  Patient Details  Name: William [name] MRN: [id number] Date of Birth: April 12, 1971 Referring Provider: Lara Mulch   Encounter Date: 01/21/2016      PT End of Session - 01/21/16 0840    Visit Number 24   Number of Visits 29   Date for PT Re-Evaluation 02/06/16   PT Start Time 0802   PT Stop Time 0840   PT Time Calculation (min) 38 min   Activity Tolerance Patient tolerated treatment well   Behavior During Therapy [facility] for tasks assessed/performed      No past medical history on file.  Past Surgical History:  Procedure Laterality Date  . BUNIONECTOMY Right 2011    There were no vitals filed for this visit.      Subjective Assessment - 01/21/16 0803    Subjective no tightness today; shoulder feels good   Currently in Pain? No/denies                         [facility] Adult PT Treatment/Exercise - 01/21/16 0805      Shoulder Exercises: Standing   Flexion Right;15 reps;Weights   Shoulder Flexion Weight (lbs) 6   Flexion Limitations 2 sets   ABduction Right;15 reps;Weights   Shoulder ABduction Weight (lbs) 6   ABduction Limitations to 90; 2 sets     Shoulder Exercises: ROM/Strengthening   UBE (Upper Arm Bike) L6 x 8 (4' fwd/4' bwd)   Cybex Row Limitations 65# x 15 each grip   Other ROM/Strengthening Exercises TRX: deltoid raise 2x15; horizontal abdct 2x15   Other ROM/Strengthening Exercises lat pull downs 55# 2x15 standing; tricep press 25# 2x15                  PT Short Term Goals - 12/13/15 0823      PT SHORT TERM GOAL #1   Title R Shoulder Flexion PROM to 140 by 11/20/15   Status Achieved     PT SHORT TERM GOAL #2   Title R Shoulder ER PROM to 45 at 60-80 ABD by 11/27/15   Status Achieved     PT SHORT TERM GOAL #3   Title pt able to sleep in bed and  without limitation by shoulder pain by 12/04/15   Status Achieved           PT Long Term Goals - 01/09/16 0839      PT LONG TERM GOAL #1   Title R Shoulder AROM at least: Flexion 155, ER 80 in 80-90 ABD, Scaption 160, and IR reach to L5 by 01/08/16   Status Achieved     PT LONG TERM GOAL #2   Title R Shoulder MMT 4/5 or better all planes by 01/08/16   Baseline R shoulder flexion still 3+/5   Status Partially Met     PT LONG TERM GOAL #3   Title pt able to return to work at full duty or mildly modified duty (may have to limit overhead activities longer) by 02/06/16   Status On-going     PT LONG TERM GOAL #4   Title Pt independent with HEP for continued progression as necessary by 02/06/16   Status On-going     PT LONG TERM GOAL #5   Title improve functional IR R shoulder to at least T8 for improved function  and motion (02/06/16)   Time 4   Period Weeks   Status New     Additional Long Term Goals   Additional Long Term Goals Yes     PT LONG TERM GOAL #6   Title improve R shoulder strength to at least 4+/5 for improved function in anticipation of return to full work duty (02/06/16   Time 4   Period Weeks   Status New               Plan - 01/21/16 0840    Clinical Impression Statement Pt denies any tightness today and reports shoulder has "turned a curve" this week and feels it is much stronger.  Decreased frequency to 1x/wk to progress home and community exercise and anticipate d/c in next few weeks.   PT Frequency 1x / week   PT Duration 4 weeks   PT Treatment/Interventions Manual techniques;Therapeutic exercise;Vasopneumatic Device;Taping;Therapeutic activities;Electrical Stimulation;Cryotherapy;Patient/family education   PT Next Visit Plan  progressive strengthening exercises   Consulted and Agree with Plan of Care Patient      Patient will benefit from skilled therapeutic intervention in order to improve the following deficits and impairments:  Pain, Decreased  strength, Decreased mobility, Decreased range of motion, Impaired UE functional use  Visit Diagnosis: Pain in right shoulder  Stiffness of right shoulder, not elsewhere classified     Problem List There are no active problems to display for this patient.      Laureen Abrahams, PT, DPT 01/21/16 8:42 AM    [facility] [address]  [place], [place], [address] Phone: [phone]   Fax:  [phone]  Name: William [name] MRN: [id number] Date of Birth: 01-Apr-1971

## 2016-01-28 ENCOUNTER — Ambulatory Visit: Payer: Managed Care, Other (non HMO) | Admitting: Physical Therapy

## 2016-01-28 DIAGNOSIS — M25511 Pain in right shoulder: Secondary | ICD-10-CM

## 2016-01-28 DIAGNOSIS — M25611 Stiffness of right shoulder, not elsewhere classified: Secondary | ICD-10-CM

## 2016-01-28 NOTE — Patient Instructions (Signed)
Resistance: 90/90 External Rotation (Slow / Fast)       Face anchor in shoulder width stance. Bend right elbow to 90, forearm forward, arm out to side. Palm down, pull forearm up slow to vertical. Repeat _15__ times.  Rest _15-30__ seconds after set. Do _2__ sets per session.  http://plyo.exer.us/217   Copyright  VHI. All rights reserved.     Resistance: 90/90 Internal Rotation (Slow / Fast)    Face away from anchor in shoulder width stance. Bend left elbow to 90, forearm up, arm out to side. Palm forward, pull forearm down slow until hand is level with elbow. Repeat _15__ times. Rest _15-30__ seconds after set. Do _2__ sets per session.  http://plyo.exer.us/216   Copyright  VHI. All rights reserved.

## 2016-01-28 NOTE — Therapy (Signed)
[facility] [address]  [place], [place], [address] Phone: [phone]   Fax:  [phone]  Physical Therapy Treatment  Patient Details  Name: William [name] MRN: [id number] Date of Birth: 02/12/1971 Referring Provider: Lara Mulch   Encounter Date: 01/28/2016      PT End of Session - 01/28/16 0841    Visit Number 25   PT Start Time 0802   PT Stop Time 0841   PT Time Calculation (min) 39 min   Activity Tolerance Patient tolerated treatment well   Behavior During Therapy [facility] for tasks assessed/performed      No past medical history on file.  Past Surgical History:  Procedure Laterality Date  . BUNIONECTOMY Right 2011    There were no vitals filed for this visit.      Subjective Assessment - 01/28/16 0804    Subjective R shoulder is fine; L wrist is a little sore today.  Not sure why it's sore.   Currently in Pain? No/denies            [facility] PT Assessment - 01/28/16 0807      AROM   Right Shoulder Internal Rotation 89 Degrees  T8     Strength   Right Shoulder Flexion 4+/5   Right Shoulder ABduction 4+/5   Right Shoulder Internal Rotation 5/5   Right Shoulder External Rotation 5/5                     [facility] Adult PT Treatment/Exercise - 01/28/16 0804      Shoulder Exercises: Prone   Retraction Right;15 reps;Weights   Retraction Weight (lbs) 5   Retraction Limitations 2 sets   Flexion Right;15 reps   Flexion Weight (lbs) 5   Flexion Limitations 2 sets   Extension Right;15 reps   Extension Weight (lbs) 5   Extension Limitations 2 sets   Other Prone Exercises elbow ext 5# 2x15     Shoulder Exercises: Standing   External Rotation Right;15 reps;Theraband   Theraband Level (Shoulder External Rotation) Level 3 (Green)   External Rotation Limitations 90 degrees abdct; 2 sets   Internal Rotation Right;15 reps;Theraband   Theraband Level (Shoulder Internal Rotation) Level 3  (Green)   Internal Rotation Limitations 90 degrees abdct; 2 sets     Shoulder Exercises: ROM/Strengthening   UBE (Upper Arm Bike) L7 x 8 (4' fwd/4' bwd)   Other ROM/Strengthening Exercises lat pull downs 55# 2x15 standing; tricep press 35# 2x15                PT Education - 01/28/16 0841    Education provided Yes   Education Details IR/ER with green theraband   Person(s) Educated Patient   Methods Explanation;Demonstration;Handout   Comprehension Verbalized understanding;Returned demonstration          PT Short Term Goals - 12/13/15 0823      PT SHORT TERM GOAL #1   Title R Shoulder Flexion PROM to 140 by 11/20/15   Status Achieved     PT SHORT TERM GOAL #2   Title R Shoulder ER PROM to 45 at 60-80 ABD by 11/27/15   Status Achieved     PT SHORT TERM GOAL #3   Title pt able to sleep in bed and without limitation by shoulder pain by 12/04/15   Status Achieved           PT Long Term Goals - 01/28/16 9702  PT LONG TERM GOAL #1   Title R Shoulder AROM at least: Flexion 155, ER 80 in 80-90 ABD, Scaption 160, and IR reach to L5 by 01/08/16   Status Achieved     PT LONG TERM GOAL #2   Title R Shoulder MMT 4/5 or better all planes by 01/08/16   Status Achieved     PT LONG TERM GOAL #3   Title pt able to return to work at full duty or mildly modified duty (may have to limit overhead activities longer) by 02/06/16   Status Achieved     PT LONG TERM GOAL #4   Title Pt independent with HEP for continued progression as necessary by 02/06/16   Status Achieved     PT LONG TERM GOAL #5   Title improve functional IR R shoulder to at least T8 for improved function and motion (02/06/16)   Status Achieved     PT LONG TERM GOAL #6   Title improve R shoulder strength to at least 4+/5 for improved function in anticipation of return to full work duty (02/06/16   Status Achieved               Plan - 01/28/16 0842    Clinical Impression Statement Pt has met all goals  and ready for d/c.  Has some mild residual strength deficits but pt has HEP and able to continue to build strength with HEP.   PT Treatment/Interventions Manual techniques;Therapeutic exercise;Vasopneumatic Device;Taping;Therapeutic activities;Electrical Stimulation;Cryotherapy;Patient/family education   PT Next Visit Plan d/c PT   Consulted and Agree with Plan of Care Patient      Patient will benefit from skilled therapeutic intervention in order to improve the following deficits and impairments:  Pain, Decreased strength, Decreased mobility, Decreased range of motion, Impaired UE functional use  Visit Diagnosis: Pain in right shoulder  Stiffness of right shoulder, not elsewhere classified     Problem List There are no active problems to display for this patient.      Laureen Abrahams, PT, DPT 01/28/16 8:43 AM    [facility] [address]  [place], [place], [address] Phone: [phone]   Fax:  [phone]  Name: William [name] MRN: [id number] Date of Birth: Jan 29, 1971      PHYSICAL THERAPY DISCHARGE SUMMARY  Visits from Start of Care: 25  Current functional level related to goals / functional outcomes: See above; all goals met   Remaining deficits: Pt has 4+/5 strength in R shoulder flexion and abduction - has HEP to progress strength.   Education / Equipment: HEP  Plan: Patient agrees to discharge.  Patient goals were met. Patient is being discharged due to meeting the stated rehab goals.  ?????     Laureen Abrahams, PT, DPT 01/28/16 8:44 AM   [facility] [place], [place] [address]  [phone] (office) [phone] (fax)

## 2016-02-04 ENCOUNTER — Ambulatory Visit: Payer: Managed Care, Other (non HMO)

## 2016-04-22 MED FILL — traMADol HCL 50 MG TABS: 50 | 15 days supply | Qty: 60 | Fill #0

## 2017-06-29 ENCOUNTER — Other Ambulatory Visit: Payer: Self-pay

## 2017-06-29 ENCOUNTER — Emergency Department (HOSPITAL_BASED_OUTPATIENT_CLINIC_OR_DEPARTMENT_OTHER)
Admission: EM | Admit: 2017-06-29 | Discharge: 2017-06-30 | Disposition: A | Discharge status: Home / Self Care | Payer: Managed Care, Other (non HMO) | Attending: Emergency Medicine | Admitting: Emergency Medicine

## 2017-06-29 ENCOUNTER — Encounter (HOSPITAL_BASED_OUTPATIENT_CLINIC_OR_DEPARTMENT_OTHER): Payer: Self-pay | Admitting: *Deleted

## 2017-06-29 DIAGNOSIS — R040 Epistaxis: Secondary | ICD-10-CM | POA: Insufficient documentation

## 2017-06-29 DIAGNOSIS — Z87891 Personal history of nicotine dependence: Secondary | ICD-10-CM | POA: Diagnosis not present

## 2017-06-29 LAB — CBC WITH DIFFERENTIAL/PLATELET
BASOS PCT: 0 %
Basophils Absolute: 0 10*3/uL (ref 0.0–0.1)
EOS ABS: 0.2 10*3/uL (ref 0.0–0.7)
EOS PCT: 2 %
HCT: 40.6 % (ref 39.0–52.0)
HEMOGLOBIN: 14.7 g/dL (ref 13.0–17.0)
Lymphocytes Relative: 27 %
Lymphs Abs: 2.4 10*3/uL (ref 0.7–4.0)
MCH: 33.3 pg (ref 26.0–34.0)
MCHC: 36.2 g/dL — ABNORMAL HIGH (ref 30.0–36.0)
MCV: 92.1 fL (ref 78.0–100.0)
MONOS PCT: 10 %
Monocytes Absolute: 0.9 10*3/uL (ref 0.1–1.0)
NEUTROS PCT: 61 %
Neutro Abs: 5.4 10*3/uL (ref 1.7–7.7)
PLATELETS: 168 10*3/uL (ref 150–400)
RBC: 4.41 MIL/uL (ref 4.22–5.81)
RDW: 12.3 % (ref 11.5–15.5)
WBC: 9 10*3/uL (ref 4.0–10.5)

## 2017-06-29 MED ORDER — OXYMETAZOLINE HCL 0.05 % NA SOLN
1.0000 | Freq: Once | NASAL | Status: AC
  Administered 2017-06-29: 1 via NASAL

## 2017-06-29 MED ORDER — OXYMETAZOLINE HCL 0.05 % NA SOLN
NASAL | Status: AC
  Administered 2017-06-29: 1 via NASAL
  Filled 2017-06-29: qty 15

## 2017-06-29 MED ORDER — SILVER NITRATE-POT NITRATE 75-25 % EX MISC
1.0000 | Freq: Once | CUTANEOUS | Status: AC
  Administered 2017-06-29: 1 via TOPICAL

## 2017-06-29 MED ORDER — SILVER NITRATE-POT NITRATE 75-25 % EX MISC
CUTANEOUS | Status: AC
  Administered 2017-06-29: 1 via TOPICAL
  Filled 2017-06-29: qty 1

## 2017-06-29 NOTE — ED Notes (Signed)
His nose started bleeding again. He is panicking. Told to hold direct pressure to his nose and sit still and pace in the triage room.

## 2017-06-29 NOTE — ED Triage Notes (Signed)
Nosebleed tonight. He put a cotton ball in his left nare to stop the bleeding. It has slowed but he can feel it oozing.

## 2017-06-29 NOTE — ED Notes (Signed)
ED Provider at bedside. 

## 2017-06-29 NOTE — ED Notes (Signed)
Pt reports cleaning out a house with mold this week and being congested x 3 days, pt reports sneezing earlier and blood came gushing out. Nose bleed lasted about , resolved, then began again later. Pt bleeding currently controlled with nose clip in place.

## 2017-06-30 ENCOUNTER — Encounter (HOSPITAL_BASED_OUTPATIENT_CLINIC_OR_DEPARTMENT_OTHER): Payer: Self-pay | Admitting: Emergency Medicine

## 2017-06-30 NOTE — ED Provider Notes (Signed)
[facility] EMERGENCY DEPARTMENT Provider Note   CSN: [id number] Arrival date & time: 06/29/17  2216     History   Chief Complaint Chief Complaint  Patient presents with  . Epistaxis    HPI William [name] is a 47 y.o. male.  The history is provided by the patient.  Epistaxis   This is a new problem. The current episode started 1 to 2 hours ago. The problem occurs constantly. The problem is associated with an unknown factor. The bleeding has been from both nares. He has tried nothing for the symptoms. The treatment provided no relief. His past medical history does not include sinus problems or frequent nosebleeds.    History reviewed. No pertinent past medical history.  There are no active problems to display for this patient.   Past Surgical History:  Procedure Laterality Date  . BUNIONECTOMY Right 2011  . ROTATOR CUFF REPAIR         Home Medications    Prior to Admission medications   Medication Sig Start Date End Date Taking? Authorizing Provider  cyclobenzaprine (FLEXERIL) 10 MG tablet Take 10 mg by mouth 3 (three) times daily as needed for muscle spasms.    [provider]  ibuprofen (ADVIL,MOTRIN) 800 MG tablet Take 800 mg by mouth every 8 (eight) hours as needed.    [provider]  oxyCODONE (OXY IR/ROXICODONE) 5 MG immediate release tablet Take 5 mg by mouth every 4 (four) hours as needed for severe pain. Reported on 11/04/2015    [provider]  traMADol (ULTRAM) 50 MG tablet Take by mouth every 6 (six) hours as needed.    [provider]    Family History No family history on file.  Social History Social History   Tobacco Use  . Smoking status: Former Smoker    Last attempt to quit: 09/13/2001    Years since quitting: 15.8  . Smokeless tobacco: Never Used  Substance Use Topics  . Alcohol use: No    Frequency: Never  . Drug use: No     Allergies   Patient has no known allergies.   Review of  Systems Review of Systems  Constitutional: Negative for fever.  HENT: Positive for nosebleeds. Negative for congestion, dental problem and facial swelling.   Respiratory: Negative for shortness of breath.   Cardiovascular: Negative for chest pain.  All other systems reviewed and are negative.    Physical Exam Updated Vital Signs BP (!) 147/88 (BP Location: Left Arm)   Pulse 82   Temp 98.3 F (36.8 C) (Oral)   Resp 18   Ht 5\' 5"  (1.651 m)   Wt 83.9 kg (185 lb)   SpO2 98%   BMI 30.79 kg/m   Physical Exam  Constitutional: He is oriented to person, place, and time. He appears well-developed and well-nourished.  HENT:  Head: Normocephalic and atraumatic.  Mouth/Throat: No oropharyngeal exudate.  B anterior kisselbach's plexus L>R  Eyes: Conjunctivae are normal. Pupils are equal, round, and reactive to light.  Neck: Normal range of motion. Neck supple.  Cardiovascular: Normal rate, regular rhythm, normal heart sounds and intact distal pulses.  Pulmonary/Chest: Effort normal and breath sounds normal. No stridor.  Abdominal: Soft. Bowel sounds are normal. There is no tenderness.  Musculoskeletal: Normal range of motion.  Neurological: He is alert and oriented to person, place, and time.  Skin: Skin is warm and dry. Capillary refill takes less than 2 seconds.     ED Treatments / Results  Labs (all labs ordered are listed, but only abnormal results are displayed) Labs Reviewed  CBC WITH DIFFERENTIAL/PLATELET - Abnormal; Notable for the following components:      Result Value   MCHC 36.2 (*)    All other components within normal limits    EKG  EKG Interpretation None       Radiology No results found.  Procedures .Epistaxis Management Date/Time: 06/30/2017 12:35 AM Performed by: Cy BlamerPalumbo, Shalen Petrak, MD Authorized by: Cy BlamerPalumbo, Leotis Isham, MD   Consent:    Consent obtained:  Verbal   Consent given by:  Patient   Risks discussed:  Bleeding, infection and nasal injury    Alternatives discussed:  No treatment Anesthesia (see MAR for exact dosages):    Anesthesia method:  None Procedure details:    Treatment site:  L anterior and R anterior   Treatment method:  Silver nitrate   Treatment complexity:  Extensive   Treatment episode: initial   Post-procedure details:    Assessment:  Bleeding stopped   Patient tolerance of procedure:  Tolerated well, no immediate complications   (including critical care time)  Medications Ordered in ED Medications  oxymetazoline (AFRIN) 0.05 % nasal spray 1 spray (1 spray Each Nare Given 06/29/17 2330)  silver nitrate applicators applicator 1 Stick (1 Stick Topical Given 06/29/17 2330)      No rebleeding, afrin BID x 2 days then humidified air in your room and saline nasal spray.   Final Clinical Impressions(s) / ED Diagnoses   Final diagnoses:  Anterior epistaxis    Return for worsening pain, vomiting blood inability to pass urine,  fevers > 100.4 unrelieved by medication, shortness of breath, intractable vomiting, or diarrhea, abdominal pain, Inability to tolerate liquids or food, cough, altered mental status or any concerns. No signs of systemic illness or infection. The patient is nontoxic-appearing on exam and vital signs are within normal limits.    I have reviewed the triage vital signs and the nursing notes. Pertinent labs &imaging results that were available during my care of the patient were reviewed by me and considered in my medical decision making (see chart for details).  After history, exam, and medical workup I feel the patient has been appropriately medically screened and is safe for discharge home. Pertinent diagnoses were discussed with the patient. Patient was given return precautions.   Jowell Bossi, MD 06/30/17 [id number]

## 2017-12-25 IMAGING — MR MR SHOULDER*R* W/O CM
5 series · 34 of 40 positions shown · non-contrast
Comparison: None.

CLINICAL DATA: Right shoulder pain, limited range of motion

EXAM:
MRI OF THE RIGHT SHOULDER WITHOUT CONTRAST
TECHNIQUE: Multiplanar, multisequence MR imaging of the shoulder was performed.
No intravenous contrast was administered.

[Series 4: T2 fat-sat · oblique · 4.0mm · 0.55mm/px · 9 of 19 slices shown (1 of 3)]
[im 1/19]
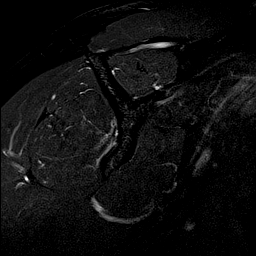
[im 3/19]
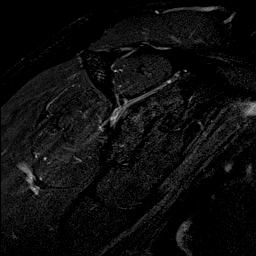
[im 5/19]
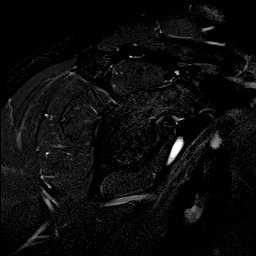
[im 7/19]
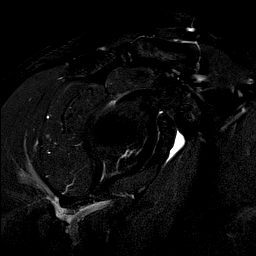
[im 10/19]
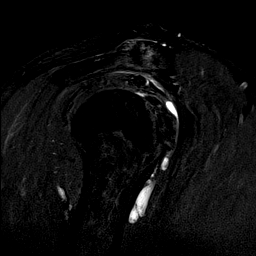
[im 12/19]
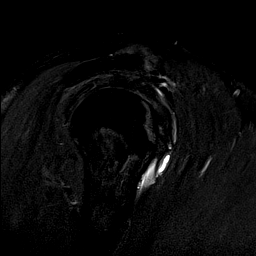
[im 14/19]
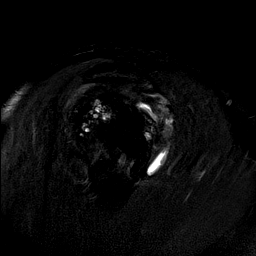
[im 16/19]
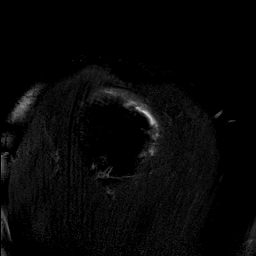
[im 19/19]
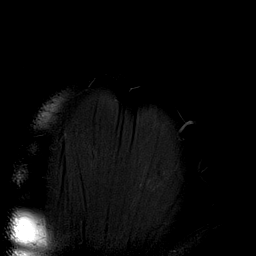

[Series 5: T2 fat-sat · oblique · 4.0mm · 0.27mm/px · 8 of 17 slices shown (2 of 3)]
[im 1/17]
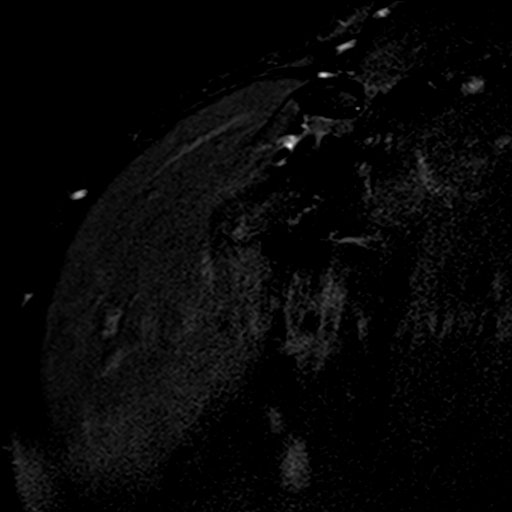
[im 3/17]
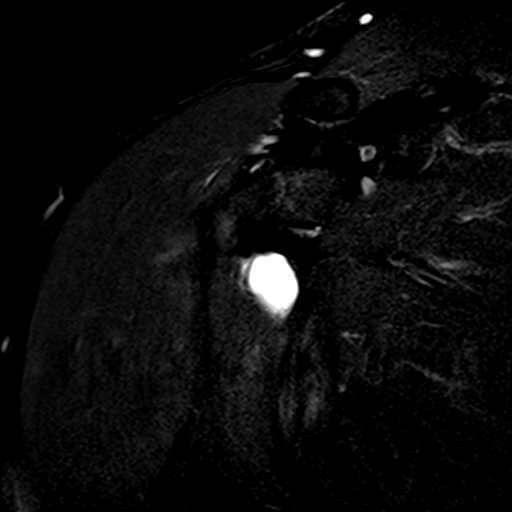
[im 5/17]
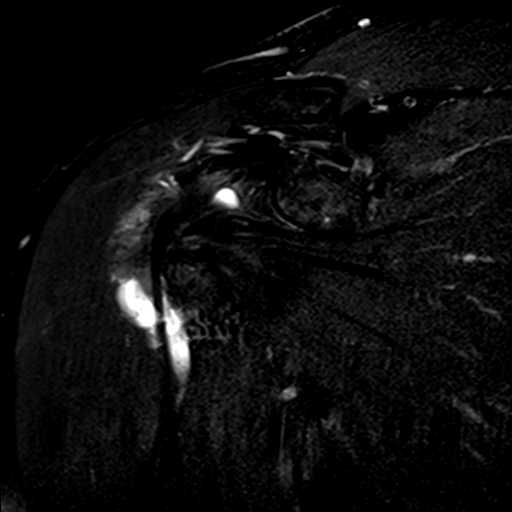
[im 7/17]
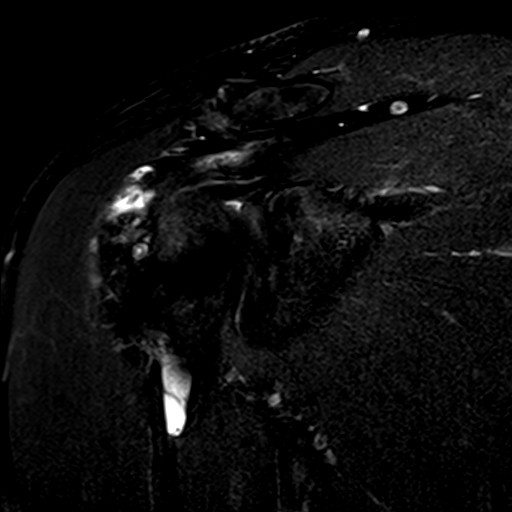
[im 10/17]
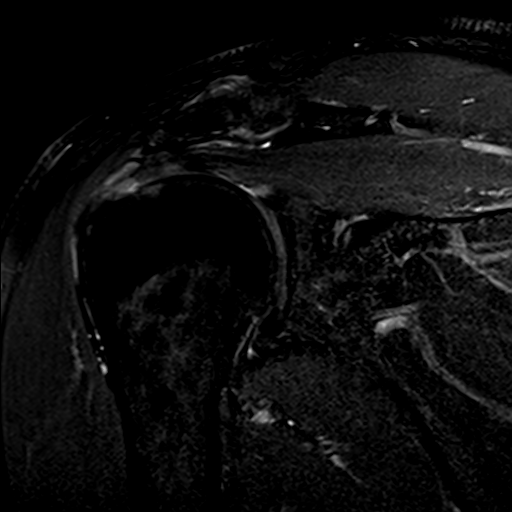
[im 12/17]
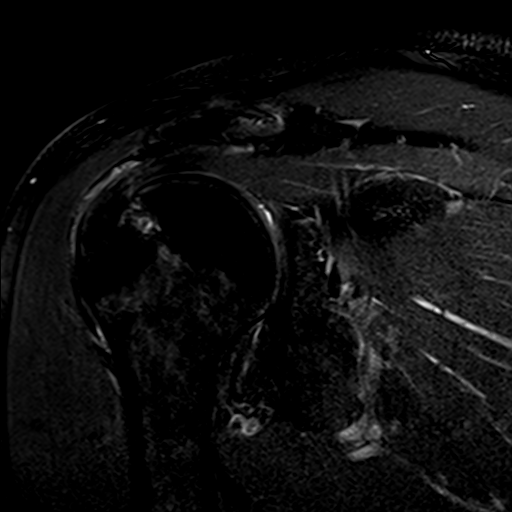
[im 14/17]
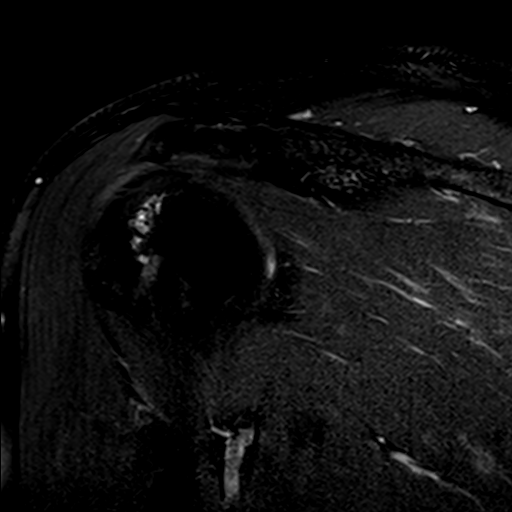
[im 17/17]
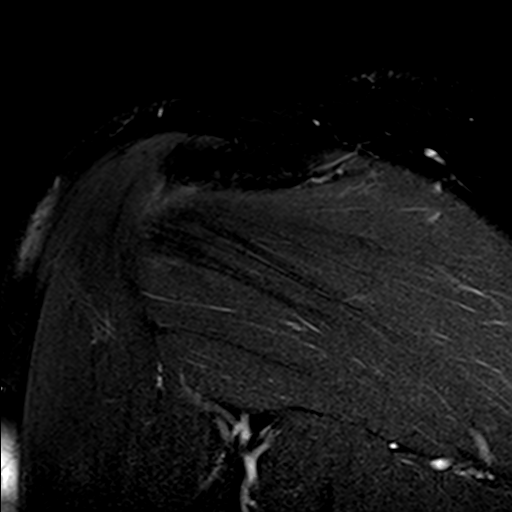

[Series 6: T1 · oblique · 4.0mm · 0.22mm/px · 2 of 19 slices shown]
[im 1/19]
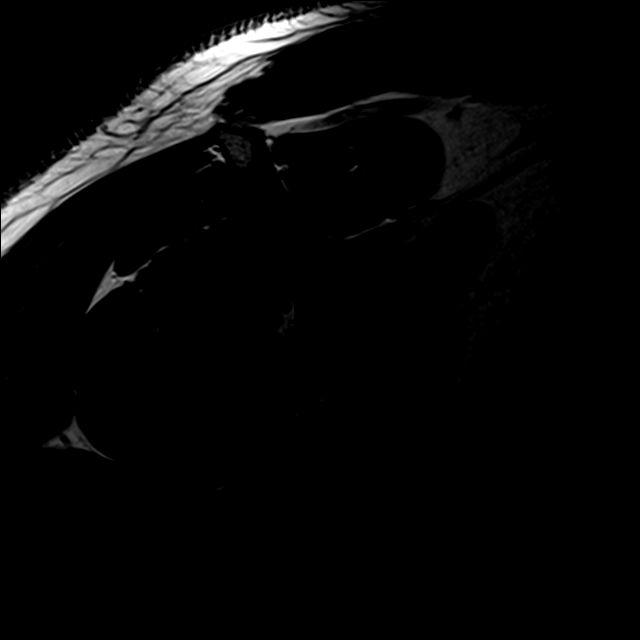
[im 3/19]
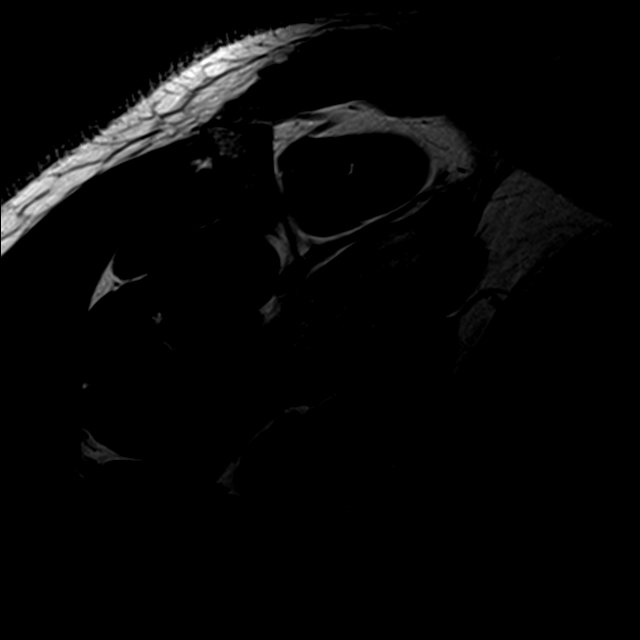

[Series 7: PD · oblique · 4.0mm · 0.44mm/px · 7 of 17 slices shown]
[im 1/17]
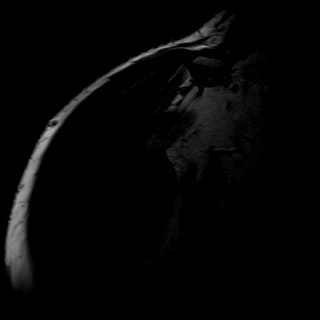
[im 3/17]
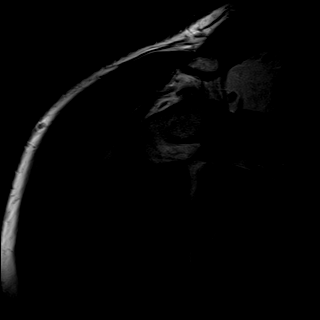
[im 6/17]
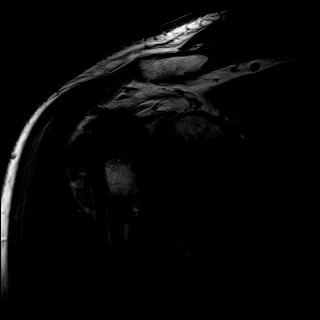
[im 9/17]
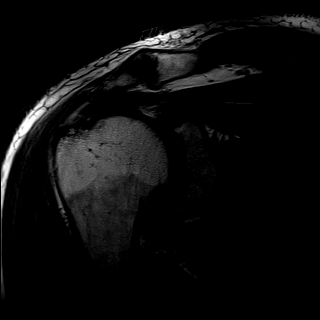
[im 11/17]
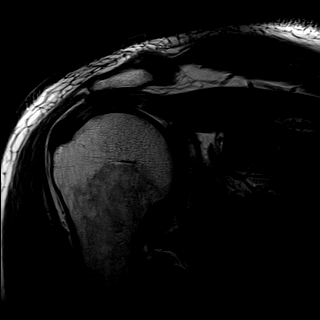
[im 14/17]
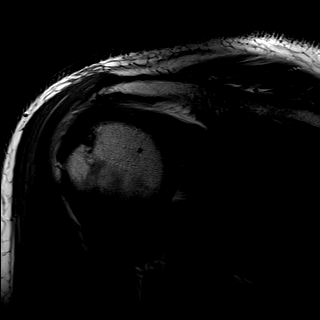
[im 17/17]
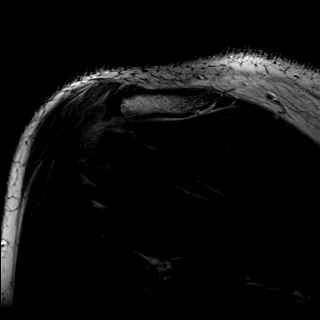

[Series 8: T2 fat-sat · axial · 4.0mm · 0.55mm/px · z∈[-31,+54]mm · 8 of 19 slices shown (3 of 3)]
[im 1/19]
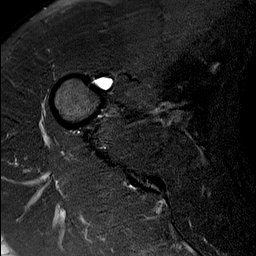
[im 3/19]
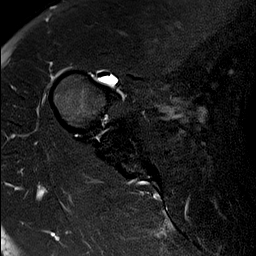
[im 6/19]
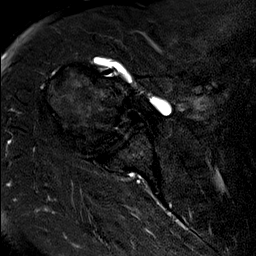
[im 8/19]
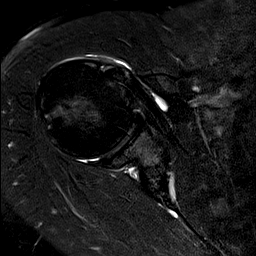
[im 11/19]
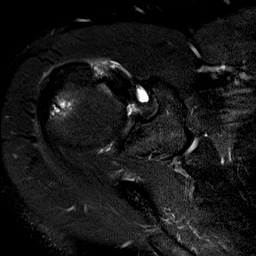
[im 13/19]
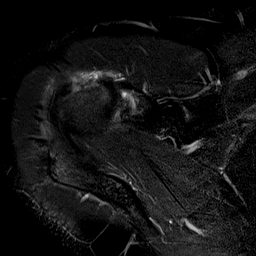
[im 16/19]
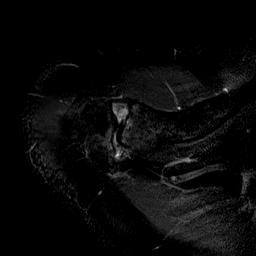
[im 19/19]
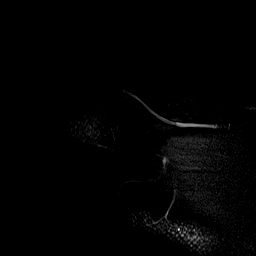

[34 of 40 positions shown; findings below may reference images not displayed]

FINDINGS: Rotator cuff: Moderate tendinosis of the supraspinatus tendon with a
high-grade partial-thickness bursal surface tear involving 90% of
the thickness measuring 11 mm in anterior - posterior dimension.
Mild tendinosis of the infraspinatus tendon without a discrete tear.
Teres minor tendon is intact. Subscapularis tendon is intact.

Muscles: No atrophy or fatty replacement of nor abnormal signal
within, the muscles of the rotator cuff.

Biceps long head:  Intact.

Acromioclavicular Joint: Mild degenerative change of the
acromioclavicular joint. Type II acromion.

Glenohumeral Joint: No joint effusion.  No chondral defect.

Labrum: Grossly intact, but evaluation is limited by lack of
intraarticular fluid.

Bones: Subcortical reactive marrow changes at the infraspinatus
insertion. No acute fracture or dislocation.
IMPRESSION: 1. Moderate tendinosis of the supraspinatus tendon with a high-grade
partial-thickness bursal surface tear involving 90% of the thickness
measuring 11 mm in anterior - posterior dimension.
2. Mild tendinosis of the infraspinatus tendon without a discrete
tear.

## 2018-10-17 MED FILL — ONDANSETRON ODT 8 MG TABLET: 8 | 10 days supply | Qty: 30 | Fill #0

## 2018-10-21 MED FILL — levoFLOXacin 750 MG TABS: 750 | 7 days supply | Qty: 7 | Fill #0

## 2018-10-21 MED FILL — ALBUTEROL SULFATE HFA 108 (: 108 (90 BAS | 25 days supply | Qty: 9 | Fill #0
